# Patient Record
Sex: Female | Born: 1975 | Race: Black or African American | Hispanic: No | Marital: Single | Smoking: Never smoker
Health system: Southern US, Community
[De-identification: ages and names within clinical notes are randomized; demographics above are authoritative.]

## PROBLEM LIST (undated history)

## (undated) DIAGNOSIS — F32A Depression, unspecified: Secondary | ICD-10-CM

## (undated) DIAGNOSIS — G4730 Sleep apnea, unspecified: Secondary | ICD-10-CM

## (undated) DIAGNOSIS — R7303 Prediabetes: Secondary | ICD-10-CM

## (undated) DIAGNOSIS — K219 Gastro-esophageal reflux disease without esophagitis: Secondary | ICD-10-CM

## (undated) DIAGNOSIS — R011 Cardiac murmur, unspecified: Secondary | ICD-10-CM

## (undated) DIAGNOSIS — E119 Type 2 diabetes mellitus without complications: Secondary | ICD-10-CM

## (undated) DIAGNOSIS — F419 Anxiety disorder, unspecified: Secondary | ICD-10-CM

## (undated) DIAGNOSIS — G43909 Migraine, unspecified, not intractable, without status migrainosus: Secondary | ICD-10-CM

## (undated) DIAGNOSIS — F329 Major depressive disorder, single episode, unspecified: Secondary | ICD-10-CM

## (undated) DIAGNOSIS — D440 Neoplasm of uncertain behavior of thyroid gland: Secondary | ICD-10-CM

## (undated) DIAGNOSIS — I10 Essential (primary) hypertension: Secondary | ICD-10-CM

## (undated) DIAGNOSIS — N200 Calculus of kidney: Secondary | ICD-10-CM

## (undated) HISTORY — PX: DILATION AND CURETTAGE OF UTERUS: SHX78

## (undated) HISTORY — PX: TUBAL LIGATION: SHX77

## (undated) HISTORY — PX: WISDOM TOOTH EXTRACTION: SHX21

---
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
## 2010-08-22 ENCOUNTER — Other Ambulatory Visit: Payer: Self-pay | Admitting: Internal Medicine

## 2010-08-22 DIAGNOSIS — K3189 Other diseases of stomach and duodenum: Secondary | ICD-10-CM

## 2010-09-01 ENCOUNTER — Ambulatory Visit
Admission: RE | Admit: 2010-09-01 | Discharge: 2010-09-01 | Disposition: A | Discharge status: Home / Self Care | Payer: Medicaid Other | Source: Ambulatory Visit | Attending: Internal Medicine | Admitting: Internal Medicine

## 2010-09-01 DIAGNOSIS — R1013 Epigastric pain: Secondary | ICD-10-CM

## 2010-09-13 ENCOUNTER — Emergency Department (HOSPITAL_COMMUNITY)
Admission: EM | Admit: 2010-09-13 | Discharge: 2010-09-13 | Disposition: A | Discharge status: Home / Self Care | Payer: Medicaid Other | Attending: Emergency Medicine | Admitting: Emergency Medicine

## 2010-09-13 ENCOUNTER — Emergency Department (HOSPITAL_COMMUNITY): Payer: Medicaid Other

## 2010-09-13 DIAGNOSIS — W010XXA Fall on same level from slipping, tripping and stumbling without subsequent striking against object, initial encounter: Secondary | ICD-10-CM | POA: Insufficient documentation

## 2010-09-13 DIAGNOSIS — I10 Essential (primary) hypertension: Secondary | ICD-10-CM | POA: Insufficient documentation

## 2010-09-13 DIAGNOSIS — M25569 Pain in unspecified knee: Secondary | ICD-10-CM | POA: Insufficient documentation

## 2010-09-13 DIAGNOSIS — F319 Bipolar disorder, unspecified: Secondary | ICD-10-CM | POA: Insufficient documentation

## 2010-11-09 ENCOUNTER — Emergency Department (HOSPITAL_COMMUNITY)
Admission: EM | Admit: 2010-11-09 | Discharge: 2010-11-10 | Disposition: A | Discharge status: Home / Self Care | Payer: Medicaid Other | Attending: Emergency Medicine | Admitting: Emergency Medicine

## 2010-11-09 DIAGNOSIS — M25569 Pain in unspecified knee: Secondary | ICD-10-CM | POA: Insufficient documentation

## 2010-11-09 DIAGNOSIS — M7120 Synovial cyst of popliteal space [Baker], unspecified knee: Secondary | ICD-10-CM | POA: Insufficient documentation

## 2010-11-09 DIAGNOSIS — I10 Essential (primary) hypertension: Secondary | ICD-10-CM | POA: Insufficient documentation

## 2011-03-14 ENCOUNTER — Emergency Department (HOSPITAL_BASED_OUTPATIENT_CLINIC_OR_DEPARTMENT_OTHER)
Admission: EM | Admit: 2011-03-14 | Discharge: 2011-03-14 | Disposition: A | Discharge status: Home / Self Care | Payer: Medicaid Other | Attending: Emergency Medicine | Admitting: Emergency Medicine

## 2011-03-14 ENCOUNTER — Encounter (HOSPITAL_BASED_OUTPATIENT_CLINIC_OR_DEPARTMENT_OTHER): Payer: Self-pay | Admitting: *Deleted

## 2011-03-14 DIAGNOSIS — G43909 Migraine, unspecified, not intractable, without status migrainosus: Secondary | ICD-10-CM | POA: Insufficient documentation

## 2011-03-14 DIAGNOSIS — H53149 Visual discomfort, unspecified: Secondary | ICD-10-CM | POA: Insufficient documentation

## 2011-03-14 HISTORY — DX: Migraine, unspecified, not intractable, without status migrainosus: G43.909

## 2011-03-14 MED ORDER — SODIUM CHLORIDE 0.9 % IV SOLN: Freq: Once | INTRAVENOUS | Status: DC

## 2011-03-14 MED ORDER — MORPHINE SULFATE 2 MG/ML IJ SOLN
2.0000 mg | Freq: Once | INTRAMUSCULAR | Status: AC
  Administered 2011-03-14: 2 mg via INTRAVENOUS
  Filled 2011-03-14: qty 1

## 2011-03-14 MED ORDER — DEXAMETHASONE SODIUM PHOSPHATE 10 MG/ML IJ SOLN
10.0000 mg | Freq: Once | INTRAMUSCULAR | Status: AC
  Administered 2011-03-14: 10 mg via INTRAVENOUS
  Filled 2011-03-14: qty 1

## 2011-03-14 MED ORDER — METOCLOPRAMIDE HCL 5 MG/ML IJ SOLN
10.0000 mg | Freq: Once | INTRAMUSCULAR | Status: AC
  Administered 2011-03-14: 10 mg via INTRAVENOUS
  Filled 2011-03-14: qty 2

## 2011-03-14 MED ORDER — DIPHENHYDRAMINE HCL 50 MG/ML IJ SOLN
25.0000 mg | Freq: Once | INTRAMUSCULAR | Status: AC
  Administered 2011-03-14: 25 mg via INTRAVENOUS
  Filled 2011-03-14: qty 1

## 2011-03-14 MED ORDER — SODIUM CHLORIDE 0.9 % IV BOLUS (SEPSIS)
1000.0000 mL | Freq: Once | INTRAVENOUS | Status: AC
  Administered 2011-03-14: 1000 mL via INTRAVENOUS

## 2011-03-14 NOTE — ED Notes (Addendum)
Pt has hx of migraines and takes meds for same, but states this H/A is different. "More pressure" No relief with usual meds. Also c/o nausea. PERLP

## 2011-03-14 NOTE — ED Provider Notes (Signed)
History   This chart was scribed for Toy Baker, MD scribed by Magnus Sinning. The patient was seen in room MH08/MH08 seen at 16:34   CSN: [id number]  Arrival date & time 03/14/11  1604   First MD Initiated Contact with Patient 03/14/11 1623      Chief Complaint  Patient presents with  . Headache    (Consider location/radiation/quality/duration/timing/severity/associated sxs/prior treatment) HPI Kelli Carroll is a 36 y.o. female who presents to the Emergency Department complaining of constant severe HA with associated nausea, onset yesterday when she was at work. Pt has a hx of migraines, but says that this migraine is worst than previous HAs. She normally takes Fioricet and says she took one today with no relief. She is unsure what triggers her headache, but says that HA is aggravated by light and loud sound. Denies vomiting,fever, and neck pain. LNMP: 03/05/11 Past Medical History  Diagnosis Date  . Migraines     Past Surgical History  Procedure Date  . Tubal ligation     History reviewed. No pertinent family history.  History  Substance Use Topics  . Smoking status: Never Smoker   . Smokeless tobacco: Not on file  . Alcohol Use: No   Review of Systems  Eyes: Positive for photophobia.  Gastrointestinal: Positive for nausea.  Neurological: Positive for headaches.  All other systems reviewed and are negative.    Allergies  Review of patient's allergies indicates no known allergies.  Home Medications   Current Outpatient Rx  Name Route Sig Dispense Refill  . AMLODIPINE BESYLATE 5 MG PO TABS Oral Take 5 mg by mouth daily.    Marland Kitchen BUTALBITAL-APAP-CAFFEINE 50-325-40 MG PO TABS Oral Take 1 tablet by mouth every 6 (six) hours as needed. For headache    . DIPHENHYDRAMINE-APAP (SLEEP) 25-500 MG PO TABS Oral Take 2 tablets by mouth at bedtime as needed. For headache    . LORATADINE 10 MG PO TABS Oral Take 10 mg by mouth daily.    Marland Kitchen OMEPRAZOLE 40 MG PO CPDR Oral Take  40 mg by mouth daily.      BP 126/87  Pulse 74  Temp(Src) 98.1 F (36.7 C) (Oral)  Resp 20  Ht 5\' 2"  (1.575 m)  Wt 246 lb (111.585 kg)  BMI 44.99 kg/m2  SpO2 100%  LMP 03/05/2011  Physical Exam  Nursing note and vitals reviewed. Constitutional: She is oriented to person, place, and time. She appears well-developed and well-nourished. No distress.  HENT:  Head: Normocephalic and atraumatic.  Eyes: EOM are normal. Pupils are equal, round, and reactive to light.  Neck: Neck supple. No tracheal deviation present.  Cardiovascular: Normal rate.   Pulmonary/Chest: Effort normal. No respiratory distress.  Abdominal: Soft. She exhibits no distension.  Musculoskeletal: Normal range of motion. She exhibits no edema.  Neurological: She is alert and oriented to person, place, and time. No sensory deficit.  Skin: Skin is warm and dry.  Psychiatric: Her behavior is normal. Her affect is blunt.    ED Course  Procedures (including critical care time) DIAGNOSTIC STUDIES: Oxygen Saturation is 100% on room air, normal by my interpretation.    COORDINATION OF CARE: 1645: Physician places orders for the following:  metoCLOPramide (REGLAN) injection 10 mg Once dexamethasone (DECADRON) injection 10 mg Once 0.9 % sodium chloride infusion Once  sodium chloride 0.9 % bolus 1,000 mL Once diphenhydrAMINE (BENADRYL) injection 25 mg Once  morphine 2 MG/ML injection 2 mg Once    Labs Reviewed - No  data to display No results found.   No diagnosis found.    MDM  I personally performed the services described in this documentation, which was scribed in my presence. The recorded information has been reviewed and considered.  Pt given meds for headache and now feels better--h/o similar episodes a/w her chronic migraines--no concern for sah or meningitis--repeat neuro exam at d/c stable--pt given neurology referral \        Toy Baker, MD 03/14/11 1734

## 2011-03-14 NOTE — Discharge Instructions (Signed)
Recurrent Migraine Headache You have a recurrent migraine headache. The caregiver can usually provide good relief for this headache. If this headache is the same as your previous migraine headaches, it is safe to treat you without repeating a complete evaluation.  These headaches usually have at least two of the following problems:   They occur on one side of the head, pulsate, and are severe enough to prevent daily activities.   They are aggravated by daily physical activities.  You may have one or more of the following symptoms:   Nausea (feeling sick to your stomach).   Vomiting.   Pain with exposure to bright lights or loud noises.  Most headache sufferers have a family history of migraines. Your headaches may also be related to alcohol and smoking habits. Too much sleep, too little sleep, mood, and anxiety may also play a part. Changing some of these triggers may help you lower the number and level of pain of the headaches. Headaches may be related to menses (female menstruation). There are numerous medications that can prevent these headaches. Your caregiver can help you with a medication or regimen (procedure to follow). If this has been a chronic (long-term) condition, the use of long-term narcotics is not recommended. Using long-term narcotics can cause recurrent migraines. Narcotics are only a temporary measure only. They are used for the infrequent migraine that fails to respond to all other measures. SEEK MEDICAL CARE IF:   You do not get relief from the medications given to you.   You have a recurrence of pain.   This headache begins to differ from past migraine (for example if it is more severe).  SEEK IMMEDIATE MEDICAL CARE IF:  You have a fever.   You have a stiff neck.   You have vision loss or have changes in vision.   You have problems with feeling lightheaded, become faint, or lose your balance.   You have muscular weakness.   You have loss of muscular control.     You develop severe symptoms different from your first symptoms.   You start losing your balance or have trouble walking.   You feel faint or pass out.  MAKE SURE YOU:   Understand these instructions.   Will watch your condition.   Will get help right away if you are not doing well or get worse.  Document Released: 09/30/2000 Document Revised: 09/17/2010 Document Reviewed: 08/25/2007 [employer] Patient Information 2012 [employer]. 

## 2011-06-10 ENCOUNTER — Emergency Department (HOSPITAL_BASED_OUTPATIENT_CLINIC_OR_DEPARTMENT_OTHER): Payer: BC Managed Care – PPO

## 2011-06-10 ENCOUNTER — Encounter (HOSPITAL_BASED_OUTPATIENT_CLINIC_OR_DEPARTMENT_OTHER): Payer: Self-pay | Admitting: *Deleted

## 2011-06-10 ENCOUNTER — Emergency Department (HOSPITAL_BASED_OUTPATIENT_CLINIC_OR_DEPARTMENT_OTHER)
Admission: EM | Admit: 2011-06-10 | Discharge: 2011-06-10 | Disposition: A | Discharge status: Home / Self Care | Payer: BC Managed Care – PPO | Attending: Emergency Medicine | Admitting: Emergency Medicine

## 2011-06-10 DIAGNOSIS — M7989 Other specified soft tissue disorders: Secondary | ICD-10-CM | POA: Insufficient documentation

## 2011-06-10 DIAGNOSIS — I10 Essential (primary) hypertension: Secondary | ICD-10-CM | POA: Insufficient documentation

## 2011-06-10 DIAGNOSIS — M25579 Pain in unspecified ankle and joints of unspecified foot: Secondary | ICD-10-CM

## 2011-06-10 HISTORY — DX: Essential (primary) hypertension: I10

## 2011-06-10 HISTORY — DX: Gastro-esophageal reflux disease without esophagitis: K21.9

## 2011-06-10 MED ORDER — HYDROCODONE-ACETAMINOPHEN 5-500 MG PO TABS: 1.0000 | ORAL_TABLET | Freq: Four times a day (QID) | ORAL | Status: AC | PRN

## 2011-06-10 MED ORDER — HYDROCODONE-ACETAMINOPHEN 5-325 MG PO TABS
1.0000 | ORAL_TABLET | Freq: Once | ORAL | Status: AC
  Administered 2011-06-10: 1 via ORAL
  Filled 2011-06-10: qty 1

## 2011-06-10 NOTE — ED Provider Notes (Signed)
Medical screening examination/treatment/procedure(s) were performed by non-physician practitioner and as supervising physician I was immediately available for consultation/collaboration.  Ethelda Chick, MD 06/10/11 2145

## 2011-06-10 NOTE — ED Provider Notes (Signed)
History     CSN: [id number]  Arrival date & time 06/10/11  8657   First MD Initiated Contact with Patient 06/10/11 1957      Chief Complaint  Patient presents with  . Ankle Pain    (Consider location/radiation/quality/duration/timing/severity/associated sxs/prior treatment) Patient is a 36 y.o. female presenting with ankle pain. The history is provided by the patient. No language interpreter was used.  Ankle Pain  The incident occurred 2 days ago. The injury mechanism is unknown. The pain is present in the right ankle. The quality of the pain is described as aching. The pain is moderate. The pain has been constant since onset. Pertinent negatives include no numbness, no inability to bear weight, no loss of motion and no tingling. She reports no foreign bodies present. The symptoms are aggravated by activity. She has tried nothing for the symptoms.    Past Medical History  Diagnosis Date  . Migraines   . Hypertension   . GERD (gastroesophageal reflux disease)     Past Surgical History  Procedure Date  . Tubal ligation   . Tubal ligation     History reviewed. No pertinent family history.  History  Substance Use Topics  . Smoking status: Never Smoker   . Smokeless tobacco: Not on file  . Alcohol Use: No    OB History    Grav Para Term Preterm Abortions TAB SAB Ect Mult Living                  Review of Systems  Constitutional: Negative.   Respiratory: Negative.   Cardiovascular: Negative.   Neurological: Negative for tingling and numbness.    Allergies  Review of patient's allergies indicates no known allergies.  Home Medications   Current Outpatient Rx  Name Route Sig Dispense Refill  . AMLODIPINE BESYLATE 5 MG PO TABS Oral Take 5 mg by mouth daily.    Marland Kitchen BUTALBITAL-APAP-CAFFEINE 50-325-40 MG PO TABS Oral Take 1 tablet by mouth every 6 (six) hours as needed. For headache    . DIPHENHYDRAMINE-APAP (SLEEP) 25-500 MG PO TABS Oral Take 2 tablets by mouth at  bedtime as needed. For headache    . LORATADINE 10 MG PO TABS Oral Take 10 mg by mouth daily.    Marland Kitchen OMEPRAZOLE 40 MG PO CPDR Oral Take 40 mg by mouth daily.      Pulse 57  Temp(Src) 98.2 F (36.8 C) (Oral)  Resp 16  Ht 5\' 2"  (1.575 m)  Wt 250 lb (113.399 kg)  BMI 45.73 kg/m2  SpO2 100%  Physical Exam  Nursing note and vitals reviewed. Constitutional: She is oriented to person, place, and time. She appears well-developed and well-nourished.  HENT:  Head: Normocephalic and atraumatic.  Cardiovascular: Normal rate and regular rhythm.   Pulmonary/Chest: Effort normal and breath sounds normal.  Musculoskeletal:       Pt has swelling and tenderness noted to the right lateral ankle:pt has full QIO:NGEXBM intact  Neurological: She is alert and oriented to person, place, and time.  Skin: Skin is warm and dry.       No redness or warmth noted to the ankle  Psychiatric: She has a normal mood and affect.    ED Course  Procedures (including critical care time)  Labs Reviewed - No data to display Dg Ankle Complete Right  06/10/2011  *RADIOLOGY REPORT*  Clinical Data: Lateral right ankle pain after exercising yesterday. No known injury.  RIGHT ANKLE - COMPLETE 3+ VIEW  Comparison: None.  Findings: No evidence of acute, subacute, or healed fractures. Ankle mortise intact with well-preserved joint space.  No intrinsic osseous abnormalities.  No evidence of a significant joint effusion.  Phlebolith noted in the subcutaneous tissues medially.  IMPRESSION: No osseous abnormality.  Original Report Authenticated By: Arnell Sieving, M.D.     1. Ankle pain       MDM  Nursing placed in aso for comfort:will treat with something for pain        Teressa Lower, NP 06/10/11 2135

## 2011-06-10 NOTE — Discharge Instructions (Signed)
Ankle Pain  Ankle pain is a common symptom. The bones, cartilage, tendons, and muscles of the ankle joint perform a lot of work each day. The ankle joint holds your body weight and allows you to move around. Ankle pain can occur on either side or back of 1 or both ankles. Ankle pain may be sharp and burning or dull and aching. There may be tenderness, stiffness, redness, or warmth around the ankle. The pain occurs more often when a person walks or puts pressure on the ankle.  CAUSES   There are many reasons ankle pain can develop. It is important to work with your caregiver to identify the cause since many conditions can impact the bones, cartilage, muscles, and tendons. Causes for ankle pain include:  · Injury, including a break (fracture), sprain, or strain often due to a fall, sports, or a high-impact activity.  · Swelling (inflammation) of a tendon (tendonitis).  · Achilles tendon rupture.  · Ankle instability after repeated sprains and strains.  · Poor foot alignment.  · Pressure on a nerve (tarsal tunnel syndrome).  · Arthritis in the ankle or the lining of the ankle.  · Crystal formation in the ankle (gout or pseudogout).  DIAGNOSIS   A diagnosis is based on your medical history, your symptoms, results of your physical exam, and results of diagnostic tests. Diagnostic tests may include X-ray exams or a computerized magnetic scan (magnetic resonance imaging, MRI).  TREATMENT   Treatment will depend on the cause of your ankle pain and may include:  · Keeping pressure off the ankle and limiting activities.  · Using crutches or other walking support (a cane or brace).  · Using rest, ice, compression, and elevation.  · Participating in physical therapy or home exercises.  · Wearing shoe inserts or special shoes.  · Losing weight.  · Taking medications to reduce pain or swelling or receiving an injection.  · Undergoing surgery.  HOME CARE INSTRUCTIONS   · Only take over-the-counter or prescription medicines for  pain, discomfort, or fever as directed by your caregiver.  · Put ice on the injured area.  · Put ice in a plastic bag.  · Place a towel between your skin and the bag.  · Leave the ice on for 15 to 20 minutes at a time, 3 to 4 times a day.  · Keep your leg raised (elevated) when possible to lessen swelling.  · Avoid activities that cause ankle pain.  · Follow specific exercises as directed by your caregiver.  · Record how often you have ankle pain, the location of the pain, and what it feels like. This information may be helpful to you and your caregiver.  · Ask your caregiver about returning to work or sports and whether you should drive.  · Follow up with your caregiver for further examination, therapy, or testing as directed.  SEEK MEDICAL CARE IF:   · Pain or swelling continues or worsens beyond 1 week.  · You have an oral temperature above 102° F (38.9° C).  · You are feeling unwell or have chills.  · You are having an increasingly difficult time with walking.  · You have loss of sensation or other new symptoms.  · You have questions or concerns.  MAKE SURE YOU:   · Understand these instructions.  · Will watch your condition.  · Will get help right away if you are not doing well or get worse.  Document Released: 06/25/2009 Document Revised: 12/25/2010 Document   Reviewed: 06/25/2009  [employer]® Patient Information ©2012 [employer].

## 2011-06-10 NOTE — ED Notes (Signed)
Pt c/o right ankle pain and lower leg pain w/o injury x 2 days

## 2011-08-18 ENCOUNTER — Emergency Department (HOSPITAL_COMMUNITY)
Admission: EM | Admit: 2011-08-18 | Discharge: 2011-08-18 | Disposition: A | Discharge status: Home / Self Care | Payer: BC Managed Care – PPO | Attending: Emergency Medicine | Admitting: Emergency Medicine

## 2011-08-18 ENCOUNTER — Encounter (HOSPITAL_COMMUNITY): Payer: Self-pay | Admitting: *Deleted

## 2011-08-18 DIAGNOSIS — K219 Gastro-esophageal reflux disease without esophagitis: Secondary | ICD-10-CM | POA: Insufficient documentation

## 2011-08-18 DIAGNOSIS — I10 Essential (primary) hypertension: Secondary | ICD-10-CM | POA: Insufficient documentation

## 2011-08-18 DIAGNOSIS — G43909 Migraine, unspecified, not intractable, without status migrainosus: Secondary | ICD-10-CM | POA: Insufficient documentation

## 2011-08-18 DIAGNOSIS — M436 Torticollis: Secondary | ICD-10-CM | POA: Insufficient documentation

## 2011-08-18 MED ORDER — TRAMADOL HCL 50 MG PO TABS: 50.0000 mg | ORAL_TABLET | Freq: Four times a day (QID) | ORAL | Status: AC | PRN

## 2011-08-18 MED ORDER — CYCLOBENZAPRINE HCL 10 MG PO TABS: 10.0000 mg | ORAL_TABLET | Freq: Two times a day (BID) | ORAL | Status: AC | PRN

## 2011-08-18 NOTE — ED Provider Notes (Signed)
Medical screening examination/treatment/procedure(s) were performed by non-physician practitioner and as supervising physician I was immediately available for consultation/collaboration.   Nyia Tsao Y. Terrye Dombrosky, MD 08/18/11 2356 

## 2011-08-18 NOTE — ED Provider Notes (Signed)
History     CSN: [id number]  Arrival date & time 08/18/11  1812   First MD Initiated Contact with Patient 08/18/11 2016      Chief Complaint  Patient presents with  . Shoulder Pain  . Neck Pain    (Consider location/radiation/quality/duration/timing/severity/associated sxs/prior treatment) HPI  36 year old female with history of migraine presents complaining of right shoulder pain. She reports pain is been ongoing for the past 2 weeks. Gradual in onset, starting from R shoulder and slowly affecting her R side of neck and down her R arm.  Pain is described as a throbbing sensation worsening with movement. Pain is causing her to have a headache. She denies any specific injury to the shoulder. She denies any rash. Denies fever, chills, chest pain, shortness of breath. Patient believes "I slept wrong".  Has tried taking OTC ibuprofen and tylenol with minimal relief.    Past Medical History  Diagnosis Date  . Migraines   . Hypertension   . GERD (gastroesophageal reflux disease)     Past Surgical History  Procedure Date  . Tubal ligation   . Tubal ligation     No family history on file.  History  Substance Use Topics  . Smoking status: Never Smoker   . Smokeless tobacco: Not on file  . Alcohol Use: No    OB History    Grav Para Term Preterm Abortions TAB SAB Ect Mult Living                  Review of Systems  Constitutional: Negative for fever.  HENT: Positive for neck pain and neck stiffness. Negative for trouble swallowing.   Respiratory: Negative for shortness of breath.   Cardiovascular: Negative for chest pain.  Skin: Negative for rash and wound.  Neurological: Negative for numbness.  All other systems reviewed and are negative.    Allergies  Review of patient's allergies indicates no known allergies.  Home Medications   Current Outpatient Rx  Name Route Sig Dispense Refill  . AMLODIPINE BESYLATE 5 MG PO TABS Oral Take 5 mg by mouth daily.    Marland Kitchen  BUTALBITAL-APAP-CAFFEINE 50-325-40 MG PO TABS Oral Take 1 tablet by mouth every 6 (six) hours as needed. For headache    . DIPHENHYDRAMINE-APAP (SLEEP) 25-500 MG PO TABS Oral Take 2 tablets by mouth at bedtime as needed. For headache    . OMEPRAZOLE 40 MG PO CPDR Oral Take 40 mg by mouth daily.      BP 155/96  Pulse 71  Temp 98.6 F (37 C) (Oral)  Resp 16  SpO2 100%  Physical Exam  Nursing note and vitals reviewed. Constitutional: She is oriented to person, place, and time. She appears well-developed and well-nourished. No distress.       Morbidly obese  HENT:  Head: Normocephalic and atraumatic.  Eyes: Conjunctivae are normal.  Neck: Neck supple. No JVD present.  Pulmonary/Chest: No stridor.  Musculoskeletal:       Patient has decreased neck range of motion especially to rotational movement. No midline spine tenderness. Tenderness to right trapezius muscle and anterior aspects of right shoulder without overlying skin changes. Decrease right shoulder range of motion actively due to pain, normal range of motion passively. No deformity noted  Lymphadenopathy:    She has no cervical adenopathy.  Neurological: She is alert and oriented to person, place, and time.  Skin: Skin is warm. No rash noted.    ED Course  Procedures (including critical care time)  Labs  Reviewed - No data to display No results found.   No diagnosis found.  1. torticollis  MDM  Right shoulder and neck pain suggestive of muscle strain, torticollis.  Normal sensation, normal grip strength no evidence of dislocation or fx.  Will give muscle relaxant, and pain medication with ortho referral.  Pt voice understanding and agrees with plan.  RICE therapy discussed.    Doubt thoracic outlet obstruction.  Does not look toxic.  Doubt meningitis, or EMC       Fayrene Helper, PA-C 08/18/11 2031  Fayrene Helper, PA-C 08/18/11 2033

## 2011-08-18 NOTE — ED Notes (Signed)
Pt reports R shoulder pain x2 weeks. Reports now going down R arm and up R neck and causing R sided HA. Denies known injury to shoulder.

## 2011-10-23 ENCOUNTER — Encounter (HOSPITAL_BASED_OUTPATIENT_CLINIC_OR_DEPARTMENT_OTHER): Payer: Self-pay

## 2011-10-23 ENCOUNTER — Emergency Department (HOSPITAL_BASED_OUTPATIENT_CLINIC_OR_DEPARTMENT_OTHER)
Admission: EM | Admit: 2011-10-23 | Discharge: 2011-10-23 | Disposition: A | Discharge status: Home / Self Care | Payer: BC Managed Care – PPO | Attending: Emergency Medicine | Admitting: Emergency Medicine

## 2011-10-23 DIAGNOSIS — I10 Essential (primary) hypertension: Secondary | ICD-10-CM | POA: Insufficient documentation

## 2011-10-23 DIAGNOSIS — G43909 Migraine, unspecified, not intractable, without status migrainosus: Secondary | ICD-10-CM | POA: Insufficient documentation

## 2011-10-23 DIAGNOSIS — K219 Gastro-esophageal reflux disease without esophagitis: Secondary | ICD-10-CM | POA: Insufficient documentation

## 2011-10-23 MED ORDER — OXYCODONE-ACETAMINOPHEN 5-325 MG PO TABS
2.0000 | ORAL_TABLET | Freq: Once | ORAL | Status: AC
  Administered 2011-10-23: 2 via ORAL
  Filled 2011-10-23 (×2): qty 2

## 2011-10-23 NOTE — ED Provider Notes (Signed)
History     CSN: [id number]  Arrival date & time 10/23/11  4098   First MD Initiated Contact with Patient 10/23/11 1927      Chief Complaint  Patient presents with  . Headache    (Consider location/radiation/quality/duration/timing/severity/associated sxs/prior treatment) HPI Patient complaining of headache began a week ago gradual onset like prior migraines.  Patient took fioricet and tylenol pm without relief. No other meds taken for headache.  No nausea, vomiting, fever, focal deficit, or fever.  No recent injury.  pmd  Past Medical History  Diagnosis Date  . Migraines   . Hypertension   . GERD (gastroesophageal reflux disease)     Past Surgical History  Procedure Date  . Tubal ligation   . Tubal ligation     No family history on file.  History  Substance Use Topics  . Smoking status: Never Smoker   . Smokeless tobacco: Not on file  . Alcohol Use: No    OB History    Grav Para Term Preterm Abortions TAB SAB Ect Mult Living                  Review of Systems  Constitutional: Negative for fever, chills, activity change, appetite change and unexpected weight change.  HENT: Negative for sore throat, rhinorrhea, neck pain, neck stiffness and sinus pressure.   Eyes: Negative for visual disturbance.  Respiratory: Negative for cough and shortness of breath.   Cardiovascular: Negative for chest pain and leg swelling.  Gastrointestinal: Negative for vomiting, abdominal pain, diarrhea and blood in stool.  Genitourinary: Negative for dysuria, urgency, frequency, vaginal discharge and difficulty urinating.  Musculoskeletal: Negative for myalgias, arthralgias and gait problem.  Skin: Negative for color change and rash.  Neurological: Negative for weakness, light-headedness and headaches.  Hematological: Does not bruise/bleed easily.  Psychiatric/Behavioral: Negative for dysphoric mood.    Allergies  Review of patient's allergies indicates no known allergies.  Home  Medications   Current Outpatient Rx  Name Route Sig Dispense Refill  . AMLODIPINE BESYLATE 5 MG PO TABS Oral Take 5 mg by mouth daily.    Marland Kitchen BUTALBITAL-APAP-CAFFEINE 50-325-40 MG PO TABS Oral Take 1 tablet by mouth every 6 (six) hours as needed. For headache    . DIPHENHYDRAMINE-APAP (SLEEP) 25-500 MG PO TABS Oral Take 2 tablets by mouth at bedtime as needed. For headache    . OMEPRAZOLE 40 MG PO CPDR Oral Take 40 mg by mouth daily.      BP 152/93  Pulse 79  Temp 97.9 F (36.6 C) (Oral)  Resp 20  Ht 5\' 4"  (1.626 m)  Wt 256 lb (116.121 kg)  BMI 43.94 kg/m2  SpO2 100%  Physical Exam  Nursing note and vitals reviewed. Constitutional: She is oriented to person, place, and time. She appears well-developed and well-nourished.  HENT:  Head: Normocephalic and atraumatic.  Eyes: Conjunctivae normal and EOM are normal. Pupils are equal, round, and reactive to light.  Neck: Normal range of motion. Neck supple.  Cardiovascular: Normal rate, regular rhythm, normal heart sounds and intact distal pulses.   Pulmonary/Chest: Effort normal and breath sounds normal.  Abdominal: Soft. Bowel sounds are normal.  Musculoskeletal: Normal range of motion.  Neurological: She is alert and oriented to person, place, and time. She has normal strength and normal reflexes. No sensory deficit. She displays a negative Romberg sign. GCS eye subscore is 4. GCS verbal subscore is 5. GCS motor subscore is 6.  Skin: Skin is warm and dry.  Psychiatric: She has a normal mood and affect. Thought content normal.    ED Course  Procedures (including critical care time)  Labs Reviewed - No data to display No results found.   No diagnosis found.    MDM  Migraine headache.  Patient given Percocet and requests discharge home.    Hilario Quarry, MD 10/23/11 [phone]

## 2011-10-23 NOTE — ED Notes (Signed)
C/o HA to back of head and behind both eyes x 1 week

## 2012-02-22 ENCOUNTER — Emergency Department (HOSPITAL_BASED_OUTPATIENT_CLINIC_OR_DEPARTMENT_OTHER)
Admission: EM | Admit: 2012-02-22 | Discharge: 2012-02-22 | Disposition: A | Discharge status: Home / Self Care | Payer: BC Managed Care – PPO | Attending: Emergency Medicine | Admitting: Emergency Medicine

## 2012-02-22 ENCOUNTER — Encounter (HOSPITAL_BASED_OUTPATIENT_CLINIC_OR_DEPARTMENT_OTHER): Payer: Self-pay | Admitting: *Deleted

## 2012-02-22 ENCOUNTER — Emergency Department (HOSPITAL_BASED_OUTPATIENT_CLINIC_OR_DEPARTMENT_OTHER): Payer: BC Managed Care – PPO

## 2012-02-22 DIAGNOSIS — R059 Cough, unspecified: Secondary | ICD-10-CM | POA: Insufficient documentation

## 2012-02-22 DIAGNOSIS — H9209 Otalgia, unspecified ear: Secondary | ICD-10-CM | POA: Insufficient documentation

## 2012-02-22 DIAGNOSIS — B9789 Other viral agents as the cause of diseases classified elsewhere: Secondary | ICD-10-CM | POA: Insufficient documentation

## 2012-02-22 DIAGNOSIS — I10 Essential (primary) hypertension: Secondary | ICD-10-CM | POA: Insufficient documentation

## 2012-02-22 DIAGNOSIS — J3489 Other specified disorders of nose and nasal sinuses: Secondary | ICD-10-CM | POA: Insufficient documentation

## 2012-02-22 DIAGNOSIS — B349 Viral infection, unspecified: Secondary | ICD-10-CM

## 2012-02-22 DIAGNOSIS — G43909 Migraine, unspecified, not intractable, without status migrainosus: Secondary | ICD-10-CM | POA: Insufficient documentation

## 2012-02-22 DIAGNOSIS — R1310 Dysphagia, unspecified: Secondary | ICD-10-CM | POA: Insufficient documentation

## 2012-02-22 DIAGNOSIS — R05 Cough: Secondary | ICD-10-CM | POA: Insufficient documentation

## 2012-02-22 DIAGNOSIS — K219 Gastro-esophageal reflux disease without esophagitis: Secondary | ICD-10-CM | POA: Insufficient documentation

## 2012-02-22 DIAGNOSIS — Z79899 Other long term (current) drug therapy: Secondary | ICD-10-CM | POA: Insufficient documentation

## 2012-02-22 MED ORDER — HYDROCODONE-HOMATROPINE 5-1.5 MG/5ML PO SYRP: 5.0000 mL | ORAL_SOLUTION | Freq: Four times a day (QID) | ORAL | Status: DC | PRN

## 2012-02-22 MED ORDER — LIDOCAINE VISCOUS 2 % MT SOLN
20.0000 mL | Freq: Once | OROMUCOSAL | Status: AC
  Administered 2012-02-22: 20 mL via OROMUCOSAL
  Filled 2012-02-22: qty 15

## 2012-02-22 MED ORDER — DEXAMETHASONE 4 MG PO TABS
10.0000 mg | ORAL_TABLET | Freq: Once | ORAL | Status: AC
  Administered 2012-02-22: 10 mg via ORAL
  Filled 2012-02-22: qty 2
  Filled 2012-02-22: qty 1

## 2012-02-22 NOTE — ED Provider Notes (Signed)
History     CSN: [id number]  Arrival date & time 02/22/12  1023   First MD Initiated Contact with Patient 02/22/12 1052      Chief Complaint  Patient presents with  . Sore Throat  . Otalgia    (Consider location/radiation/quality/duration/timing/severity/associated sxs/prior treatment) HPI The patient presents with illness for 4 days.  Since onset she has had cough, congestion, generalized discomfort, and increasingly severe sore throat with worsening dysphagia.  No relief with OTC medication. No confusion, no disorientation, no visual changes, no ataxia, no dyspnea.  The patient states that the most severe aspect of her illness is the sore throat.  Past Medical History  Diagnosis Date  . Migraines   . Hypertension   . GERD (gastroesophageal reflux disease)     Past Surgical History  Procedure Date  . Tubal ligation   . Tubal ligation     History reviewed. No pertinent family history.  History  Substance Use Topics  . Smoking status: Never Smoker   . Smokeless tobacco: Not on file  . Alcohol Use: No    OB History    Grav Para Term Preterm Abortions TAB SAB Ect Mult Living                  Review of Systems  Constitutional:       Per HPI, otherwise negative  HENT:       Per HPI, otherwise negative  Eyes: Negative.   Respiratory:       Per HPI, otherwise negative  Cardiovascular:       Per HPI, otherwise negative  Gastrointestinal: Negative for vomiting.  Genitourinary: Negative.   Musculoskeletal:       Per HPI, otherwise negative  Skin: Negative.   Neurological: Negative for syncope.    Allergies  Review of patient's allergies indicates no known allergies.  Home Medications   Current Outpatient Rx  Name  Route  Sig  Dispense  Refill  . AMLODIPINE BESYLATE 5 MG PO TABS   Oral   Take 5 mg by mouth daily.         Marland Kitchen BUTALBITAL-APAP-CAFFEINE 50-325-40 MG PO TABS   Oral   Take 1 tablet by mouth every 6 (six) hours as needed. For headache         . DIPHENHYDRAMINE-APAP (SLEEP) 25-500 MG PO TABS   Oral   Take 2 tablets by mouth at bedtime as needed. For headache         . OMEPRAZOLE 40 MG PO CPDR   Oral   Take 40 mg by mouth daily.           BP 146/94  Pulse 97  Temp 98.1 F (36.7 C) (Oral)  Resp 20  Ht 5\' 2"  (1.575 m)  Wt 246 lb (111.585 kg)  BMI 44.99 kg/m2  SpO2 99%  LMP 11/22/2011  Physical Exam  Nursing note and vitals reviewed. Constitutional: She is oriented to person, place, and time. She appears well-developed and well-nourished. No distress.  HENT:  Head: Normocephalic and atraumatic.  Mouth/Throat: Uvula is midline, oropharynx is clear and moist and mucous membranes are normal.       No exudate, clear posterior oropharynx, no palpable lymph nodes throughout  Eyes: Conjunctivae normal and EOM are normal.  Cardiovascular: Normal rate and regular rhythm.   Pulmonary/Chest: Effort normal and breath sounds normal. No stridor. No respiratory distress.  Abdominal: She exhibits no distension.  Musculoskeletal: She exhibits no edema.  Neurological: She is alert and  oriented to person, place, and time. No cranial nerve deficit.  Skin: Skin is warm and dry.  Psychiatric: She has a normal mood and affect.    ED Course  Procedures (including critical care time)  Labs Reviewed - No data to display No results found.   No diagnosis found.  Pulse ox 99% room air normal  12:28 PM Patient sitting upright, in no distress.  She states that she feels somewhat better. MDM  This well appearing young F p/w several days, of cough, congestion, generalized discomfort and sore throat.  On exam the patient is hemodynamically stable, in no distress.  There is no evidence of respiratory compromise.  The patient's x-ray does not demonstrate pneumonia, she was discharged after she had some symptomatic improvement with ED interventions.        Gerhard Munch, MD 02/22/12 1230

## 2012-02-22 NOTE — ED Notes (Signed)
Sore throat and ears stopped up coughing at night states feels like sinus drainage causing her to cough

## 2012-04-11 ENCOUNTER — Encounter (HOSPITAL_BASED_OUTPATIENT_CLINIC_OR_DEPARTMENT_OTHER): Payer: Self-pay

## 2012-04-11 ENCOUNTER — Emergency Department (HOSPITAL_BASED_OUTPATIENT_CLINIC_OR_DEPARTMENT_OTHER)
Admission: EM | Admit: 2012-04-11 | Discharge: 2012-04-11 | Disposition: A | Discharge status: Home / Self Care | Payer: BC Managed Care – PPO | Attending: Emergency Medicine | Admitting: Emergency Medicine

## 2012-04-11 DIAGNOSIS — R52 Pain, unspecified: Secondary | ICD-10-CM | POA: Insufficient documentation

## 2012-04-11 DIAGNOSIS — K219 Gastro-esophageal reflux disease without esophagitis: Secondary | ICD-10-CM | POA: Insufficient documentation

## 2012-04-11 DIAGNOSIS — J020 Streptococcal pharyngitis: Secondary | ICD-10-CM | POA: Insufficient documentation

## 2012-04-11 DIAGNOSIS — R509 Fever, unspecified: Secondary | ICD-10-CM | POA: Insufficient documentation

## 2012-04-11 DIAGNOSIS — Z79899 Other long term (current) drug therapy: Secondary | ICD-10-CM | POA: Insufficient documentation

## 2012-04-11 DIAGNOSIS — G43909 Migraine, unspecified, not intractable, without status migrainosus: Secondary | ICD-10-CM | POA: Insufficient documentation

## 2012-04-11 DIAGNOSIS — R61 Generalized hyperhidrosis: Secondary | ICD-10-CM | POA: Insufficient documentation

## 2012-04-11 DIAGNOSIS — R110 Nausea: Secondary | ICD-10-CM | POA: Insufficient documentation

## 2012-04-11 DIAGNOSIS — I10 Essential (primary) hypertension: Secondary | ICD-10-CM | POA: Insufficient documentation

## 2012-04-11 MED ORDER — OXYCODONE-ACETAMINOPHEN 5-325 MG PO TABS
1.0000 | ORAL_TABLET | Freq: Once | ORAL | Status: AC
  Administered 2012-04-11: 1 via ORAL
  Filled 2012-04-11 (×2): qty 1

## 2012-04-11 MED ORDER — PENICILLIN G BENZATHINE 1200000 UNIT/2ML IM SUSP
1.2000 10*6.[IU] | Freq: Once | INTRAMUSCULAR | Status: AC
  Administered 2012-04-11: 1.2 10*6.[IU] via INTRAMUSCULAR
  Filled 2012-04-11: qty 2

## 2012-04-11 MED ORDER — DEXAMETHASONE 4 MG PO TABS
12.0000 mg | ORAL_TABLET | Freq: Once | ORAL | Status: AC
  Administered 2012-04-11: 12 mg via ORAL
  Filled 2012-04-11: qty 3

## 2012-04-11 MED ORDER — OXYCODONE-ACETAMINOPHEN 5-325 MG PO TABS: 1.0000 | ORAL_TABLET | ORAL | Status: DC | PRN

## 2012-04-11 NOTE — ED Notes (Signed)
Pt states that she feels fine after receiving IM injection of Penicillin, pt Ok'd to leave at this time.  No signs/sx of rxn.

## 2012-04-11 NOTE — ED Provider Notes (Signed)
History     CSN: [id number]  Arrival date & time 04/11/12  0714   First MD Initiated Contact with Patient 04/11/12 0732      Chief Complaint  Patient presents with  . Cough  . Generalized Body Aches    (Consider location/radiation/quality/duration/timing/severity/associated sxs/prior treatment) Patient is a 37 y.o. female presenting with cough. The history is provided by the patient.  Cough She had onset yesterday of sore throat with associated fever, chills, sweats. There's been a slight cough which is nonproductive. She's complaining of generalized bodyaches. Peak temperature was 101. She denies nasal congestion or rhinorrhea. There's been nausea but no vomiting. She denies constipation or diarrhea. She denies sick contacts. Pain is severe and she rates it at 10/10. She has taken naproxen and Vicryl with temporary relief.  Past Medical History  Diagnosis Date  . Migraines   . Hypertension   . GERD (gastroesophageal reflux disease)     Past Surgical History  Procedure Laterality Date  . Tubal ligation    . Tubal ligation      History reviewed. No pertinent family history.  History  Substance Use Topics  . Smoking status: Never Smoker   . Smokeless tobacco: Not on file  . Alcohol Use: No    OB History   Grav Para Term Preterm Abortions TAB SAB Ect Mult Living                  Review of Systems  Respiratory: Positive for cough.   All other systems reviewed and are negative.    Allergies  Review of patient's allergies indicates no known allergies.  Home Medications   Current Outpatient Rx  Name  Route  Sig  Dispense  Refill  . amLODipine (NORVASC) 5 MG tablet   Oral   Take 5 mg by mouth daily.         . butalbital-acetaminophen-caffeine (FIORICET, ESGIC) 50-325-40 MG per tablet   Oral   Take 1 tablet by mouth every 6 (six) hours as needed. For headache         . diphenhydramine-acetaminophen (TYLENOL PM EXTRA STRENGTH) 25-500 MG TABS   Oral  Take 2 tablets by mouth at bedtime as needed. For headache         . HYDROcodone-homatropine (HYCODAN) 5-1.5 MG/5ML syrup   Oral   Take 5 mLs by mouth every 6 (six) hours as needed for cough.   120 mL   0   . omeprazole (PRILOSEC) 40 MG capsule   Oral   Take 40 mg by mouth daily.           BP 133/76  Pulse 104  Temp(Src) 99.3 F (37.4 C) (Oral)  Resp 20  SpO2 98%  Physical Exam  Nursing note and vitals reviewed.  37 year old female, resting comfortably and in no acute distress. Vital signs are significant for mild tachycardia with heart rate 104. Oxygen saturation is 98%, which is normal. Head is normocephalic and atraumatic. PERRLA, EOMI. Oropharynx is mildly erythematous. There is mild to moderate tonsillar hypertrophy without exudate. She has normal phonation and is not having any difficulty with secretions. Neck is nontender and supple without adenopathy or JVD. Back is mildly tender diffusely. Lungs are clear without rales, wheezes, or rhonchi. Chest is mildly tender diffusely. Heart has regular rate and rhythm with 1-2/6 systolic ejection murmur heard along the sternal border consistent with a flow murmur. Abdomen is soft, flat, nontender without masses or hepatosplenomegaly and peristalsis is normoactive. Extremities  have no cyanosis or edema, full range of motion is present. Skin is warm and dry without rash. Neurologic: Mental status is normal, cranial nerves are intact, there are no motor or sensory deficits.  ED Course  Procedures (including critical care time)  Results for orders placed during the hospital encounter of 04/11/12  RAPID STREP SCREEN      Result Value Range   Streptococcus, Group A Screen (Direct) POSITIVE (*) NEGATIVE   No results found.    Date: 04/11/2012  Rate: 111  Rhythm: sinus tachycardia  QRS Axis: normal  Intervals: normal  ST/T Wave abnormalities: normal  Conduction Disutrbances:none  Narrative Interpretation: Sinus  tachycardia, otherwise normal ECG. No prior ECG available for comparison.  Old EKG Reviewed: none available    1. Acute streptococcal pharyngitis       MDM  Respiratory tract infection with pharyngitis and cough which is most likely viral. Strep screen will be obtained. She'll be given empiric treatment with dexamethasone.  Strep screen is come back positive. She's given an injection of Bicillin LA and discharged with prescription for Percocet. Note is given to be off work today and tomorrow.      Dione Booze, MD 04/11/12 [phone]

## 2012-04-11 NOTE — ED Notes (Signed)
Pt states that she has had cough x1 day, c/o generalized body aches, chest wall pain, nausea, no vomiting.  Pt states that she abdominal pain.

## 2012-04-13 ENCOUNTER — Emergency Department (HOSPITAL_COMMUNITY)
Admission: EM | Admit: 2012-04-13 | Discharge: 2012-04-13 | Disposition: A | Discharge status: Home / Self Care | Payer: BC Managed Care – PPO | Attending: Emergency Medicine | Admitting: Emergency Medicine

## 2012-04-13 ENCOUNTER — Encounter (HOSPITAL_COMMUNITY): Payer: Self-pay | Admitting: Emergency Medicine

## 2012-04-13 DIAGNOSIS — G43909 Migraine, unspecified, not intractable, without status migrainosus: Secondary | ICD-10-CM | POA: Insufficient documentation

## 2012-04-13 DIAGNOSIS — R509 Fever, unspecified: Secondary | ICD-10-CM | POA: Insufficient documentation

## 2012-04-13 DIAGNOSIS — R51 Headache: Secondary | ICD-10-CM | POA: Insufficient documentation

## 2012-04-13 DIAGNOSIS — J020 Streptococcal pharyngitis: Secondary | ICD-10-CM | POA: Insufficient documentation

## 2012-04-13 DIAGNOSIS — I10 Essential (primary) hypertension: Secondary | ICD-10-CM | POA: Insufficient documentation

## 2012-04-13 DIAGNOSIS — K219 Gastro-esophageal reflux disease without esophagitis: Secondary | ICD-10-CM | POA: Insufficient documentation

## 2012-04-13 DIAGNOSIS — R110 Nausea: Secondary | ICD-10-CM | POA: Insufficient documentation

## 2012-04-13 DIAGNOSIS — R197 Diarrhea, unspecified: Secondary | ICD-10-CM | POA: Insufficient documentation

## 2012-04-13 DIAGNOSIS — Z79899 Other long term (current) drug therapy: Secondary | ICD-10-CM | POA: Insufficient documentation

## 2012-04-13 DIAGNOSIS — H9209 Otalgia, unspecified ear: Secondary | ICD-10-CM | POA: Insufficient documentation

## 2012-04-13 MED ORDER — OXYCODONE-ACETAMINOPHEN 5-325 MG PO TABS: 1.0000 | ORAL_TABLET | ORAL | Status: DC | PRN

## 2012-04-13 MED ORDER — ONDANSETRON 8 MG PO TBDP
8.0000 mg | ORAL_TABLET | Freq: Once | ORAL | Status: AC
  Administered 2012-04-13: 8 mg via ORAL
  Filled 2012-04-13: qty 1

## 2012-04-13 MED ORDER — DEXAMETHASONE SODIUM PHOSPHATE 10 MG/ML IJ SOLN
10.0000 mg | Freq: Once | INTRAMUSCULAR | Status: AC
  Administered 2012-04-13: 10 mg via INTRAMUSCULAR
  Filled 2012-04-13: qty 1

## 2012-04-13 MED ORDER — CLINDAMYCIN HCL 150 MG PO CAPS: 450.0000 mg | ORAL_CAPSULE | Freq: Three times a day (TID) | ORAL | Status: DC

## 2012-04-13 MED ORDER — CLINDAMYCIN HCL 300 MG PO CAPS
450.0000 mg | ORAL_CAPSULE | Freq: Once | ORAL | Status: AC
  Administered 2012-04-13: 450 mg via ORAL
  Filled 2012-04-13: qty 1

## 2012-04-13 MED ORDER — HYDROMORPHONE HCL PF 1 MG/ML IJ SOLN
1.0000 mg | Freq: Once | INTRAMUSCULAR | Status: AC
  Administered 2012-04-13: 1 mg via INTRAMUSCULAR
  Filled 2012-04-13: qty 1

## 2012-04-13 MED ORDER — IBUPROFEN 800 MG PO TABS: 800.0000 mg | ORAL_TABLET | Freq: Three times a day (TID) | ORAL | Status: DC

## 2012-04-13 NOTE — ED Provider Notes (Signed)
History     CSN: [id number]  Arrival date & time 04/13/12  1054   First MD Initiated Contact with Patient 04/13/12 1105      Chief Complaint  Patient presents with  . Sore Throat    (Consider location/radiation/quality/duration/timing/severity/associated sxs/prior treatment) HPI Kelli Carroll is a 37 y.o. female who presents to ED with complaint of sore throat, fever, nausea, diarrhea. States was seen two days ago, was diagnosed with strep pharyngitis with positive rapid strep. Pt was given bicillin shot. States since then, feels like tonsils have gotten even more painful and swollen. States taking aleve with no improvement. States ran out of percocet that was given. Pt also states has had some nausea, vomited few times, and loose frequent stools. Denies cough, chest pain, abdominal pain, shortness of breath. States able to swallow liquids, but reports it being painful.   Past Medical History  Diagnosis Date  . Migraines   . Hypertension   . GERD (gastroesophageal reflux disease)     Past Surgical History  Procedure Laterality Date  . Tubal ligation    . Tubal ligation      History reviewed. No pertinent family history.  History  Substance Use Topics  . Smoking status: Never Smoker   . Smokeless tobacco: Not on file  . Alcohol Use: No    OB History   Grav Para Term Preterm Abortions TAB SAB Ect Mult Living                  Review of Systems  Constitutional: Positive for fever and chills.  HENT: Positive for ear pain and sore throat. Negative for congestion, trouble swallowing, neck pain, neck stiffness and voice change.   Respiratory: Negative.   Cardiovascular: Negative.   Gastrointestinal: Negative.   Musculoskeletal: Negative.   Skin: Negative.   Neurological: Positive for headaches. Negative for weakness and numbness.    Allergies  Review of patient's allergies indicates no known allergies.  Home Medications   Current Outpatient Rx  Name  Route   Sig  Dispense  Refill  . amLODipine (NORVASC) 5 MG tablet   Oral   Take 5 mg by mouth daily.         . butalbital-acetaminophen-caffeine (FIORICET, ESGIC) 50-325-40 MG per tablet   Oral   Take 1 tablet by mouth every 6 (six) hours as needed. For headache         . diphenhydramine-acetaminophen (TYLENOL PM EXTRA STRENGTH) 25-500 MG TABS   Oral   Take 2 tablets by mouth at bedtime as needed. For headache         . HYDROcodone-homatropine (HYCODAN) 5-1.5 MG/5ML syrup   Oral   Take 5 mLs by mouth every 6 (six) hours as needed for cough.   120 mL   0   . omeprazole (PRILOSEC) 40 MG capsule   Oral   Take 40 mg by mouth daily.         Marland Kitchen oxyCODONE-acetaminophen (ROXICET) 5-325 MG per tablet   Oral   Take 1 tablet by mouth every 4 (four) hours as needed for pain.   12 tablet   0     BP 145/97  Pulse 97  Temp(Src) 98.1 F (36.7 C) (Oral)  Resp 20  SpO2 100%  Physical Exam  Nursing note and vitals reviewed. Constitutional: She is oriented to person, place, and time. She appears well-developed and well-nourished. No distress.  HENT:  Head: Normocephalic and atraumatic.  Right Ear: Tympanic membrane, external ear and ear  canal normal.  Left Ear: Tympanic membrane, external ear and ear canal normal.  Nose: Nose normal.  Mouth/Throat: Uvula is midline and mucous membranes are normal. No dental abscesses. Oropharyngeal exudate and posterior oropharyngeal erythema present. No tonsillar abscesses.  Eyes: Conjunctivae are normal.  Neck: Neck supple.  Cardiovascular: Normal rate, regular rhythm and normal heart sounds.   Pulmonary/Chest: Effort normal and breath sounds normal. No respiratory distress. She has no wheezes. She has no rales.  Abdominal: Soft. Bowel sounds are normal. She exhibits no distension. There is no tenderness. There is no rebound.  Musculoskeletal: She exhibits no edema.  Lymphadenopathy:    She has cervical adenopathy.  Neurological: She is alert and  oriented to person, place, and time.  Skin: Skin is warm and dry.    ED Course  Procedures (including critical care time)  1. Strep pharyngitis       MDM  Pt with positive strep screen 2 days ago. Here with not improving pain. Received 1.2 million units of bicillin 2 days ago. On exam, tonsils mildly enlarged, exudate present, they are symmetrical. Uvula non edematous, midline. No signs of peritonsillar abscess. No trismus. Pt is able to swallow in ED. Given dilaudid 1mg  IM, decadron IM. Will d/c home with clindamycin, pain medications, follow up.   Filed Vitals:   04/13/12 1115 04/13/12 1247  BP: 145/97 117/78  Pulse: 97 97  Temp: 98.1 F (36.7 C) 98.3 F (36.8 C)  TempSrc: Oral Oral  Resp: 20 16  SpO2: 100% 100%           Lottie Mussel, PA-C 04/13/12 1549

## 2012-04-13 NOTE — ED Notes (Signed)
Patient states she was seen on Monday and diagnosed with strep throat.  Patient states she hasn't improved and is having N/V/D and fever of 101.2.

## 2012-04-13 NOTE — ED Notes (Signed)
PA bedside with pt 

## 2012-04-15 NOTE — ED Provider Notes (Signed)
Medical screening examination/treatment/procedure(s) were performed by non-physician practitioner and as supervising physician I was immediately available for consultation/collaboration.   Laray Anger, DO 04/15/12 1906

## 2012-04-17 ENCOUNTER — Emergency Department (HOSPITAL_BASED_OUTPATIENT_CLINIC_OR_DEPARTMENT_OTHER): Payer: BC Managed Care – PPO

## 2012-04-17 ENCOUNTER — Emergency Department (HOSPITAL_BASED_OUTPATIENT_CLINIC_OR_DEPARTMENT_OTHER)
Admission: EM | Admit: 2012-04-17 | Discharge: 2012-04-17 | Disposition: A | Discharge status: Home / Self Care | Payer: BC Managed Care – PPO | Attending: Emergency Medicine | Admitting: Emergency Medicine

## 2012-04-17 ENCOUNTER — Encounter (HOSPITAL_BASED_OUTPATIENT_CLINIC_OR_DEPARTMENT_OTHER): Payer: Self-pay | Admitting: *Deleted

## 2012-04-17 ENCOUNTER — Other Ambulatory Visit: Payer: Self-pay

## 2012-04-17 DIAGNOSIS — K219 Gastro-esophageal reflux disease without esophagitis: Secondary | ICD-10-CM | POA: Insufficient documentation

## 2012-04-17 DIAGNOSIS — R071 Chest pain on breathing: Secondary | ICD-10-CM | POA: Insufficient documentation

## 2012-04-17 DIAGNOSIS — B349 Viral infection, unspecified: Secondary | ICD-10-CM

## 2012-04-17 DIAGNOSIS — Z8679 Personal history of other diseases of the circulatory system: Secondary | ICD-10-CM | POA: Insufficient documentation

## 2012-04-17 DIAGNOSIS — Z79899 Other long term (current) drug therapy: Secondary | ICD-10-CM | POA: Insufficient documentation

## 2012-04-17 DIAGNOSIS — B9789 Other viral agents as the cause of diseases classified elsewhere: Secondary | ICD-10-CM | POA: Insufficient documentation

## 2012-04-17 DIAGNOSIS — I10 Essential (primary) hypertension: Secondary | ICD-10-CM | POA: Insufficient documentation

## 2012-04-17 DIAGNOSIS — R05 Cough: Secondary | ICD-10-CM | POA: Insufficient documentation

## 2012-04-17 DIAGNOSIS — R109 Unspecified abdominal pain: Secondary | ICD-10-CM | POA: Insufficient documentation

## 2012-04-17 DIAGNOSIS — M549 Dorsalgia, unspecified: Secondary | ICD-10-CM | POA: Insufficient documentation

## 2012-04-17 DIAGNOSIS — R0789 Other chest pain: Secondary | ICD-10-CM

## 2012-04-17 DIAGNOSIS — R0602 Shortness of breath: Secondary | ICD-10-CM | POA: Insufficient documentation

## 2012-04-17 DIAGNOSIS — R059 Cough, unspecified: Secondary | ICD-10-CM | POA: Insufficient documentation

## 2012-04-17 LAB — CBC WITH DIFFERENTIAL/PLATELET
Basophils Relative: 0 % (ref 0–1)
Eosinophils Absolute: 0.2 10*3/uL (ref 0.0–0.7)
Eosinophils Relative: 1 % (ref 0–5)
HCT: 38.9 % (ref 36.0–46.0)
Hemoglobin: 13.3 g/dL (ref 12.0–15.0)
Lymphs Abs: 3.8 10*3/uL (ref 0.7–4.0)
MCH: 28.9 pg (ref 26.0–34.0)
MCHC: 34.2 g/dL (ref 30.0–36.0)
MCV: 84.6 fL (ref 78.0–100.0)
Monocytes Absolute: 1.3 10*3/uL — ABNORMAL HIGH (ref 0.1–1.0)
Monocytes Relative: 9 % (ref 3–12)
Neutrophils Relative %: 64 % (ref 43–77)
RBC: 4.6 MIL/uL (ref 3.87–5.11)

## 2012-04-17 LAB — D-DIMER, QUANTITATIVE: D-Dimer, Quant: <0.27 ug/mL-FEU (ref 0.00–0.48)

## 2012-04-17 LAB — COMPREHENSIVE METABOLIC PANEL
ALT: 26 U/L (ref 0–35)
BUN: 9 mg/dL (ref 6–23)
CO2: 25 mEq/L (ref 19–32)
Calcium: 9.5 mg/dL (ref 8.4–10.5)
GFR calc Af Amer: >90 mL/min (ref >90)
GFR calc non Af Amer: 81 mL/min — ABNORMAL LOW (ref >90)
Glucose, Bld: 95 mg/dL (ref 70–99)
Sodium: 140 mEq/L (ref 135–145)

## 2012-04-17 LAB — URINALYSIS, ROUTINE W REFLEX MICROSCOPIC
Leukocytes, UA: NEGATIVE
Nitrite: NEGATIVE
Protein, ur: NEGATIVE mg/dL
Urobilinogen, UA: 0.2 mg/dL (ref 0.0–1.0)

## 2012-04-17 LAB — TROPONIN I: Troponin I: <0.3 ng/mL (ref <0.30)

## 2012-04-17 MED ORDER — SODIUM CHLORIDE 0.9 % IV BOLUS (SEPSIS)
500.0000 mL | Freq: Once | INTRAVENOUS | Status: AC
  Administered 2012-04-17: 500 mL via INTRAVENOUS

## 2012-04-17 MED ORDER — PROMETHAZINE HCL 25 MG PO TABS: 25.0000 mg | ORAL_TABLET | Freq: Four times a day (QID) | ORAL | Status: DC | PRN

## 2012-04-17 NOTE — ED Notes (Addendum)
Pt states she was seen here Mon and dx'd with strep. Given a Penicillin shot and placed on an antibx. Wed "No better" Switched to Clindimycin. Friday felt much worse and now c/o chest pressure rad into back and diarrhea +nausea. No period x 6 months "I dont know why"

## 2012-04-17 NOTE — ED Notes (Signed)
rx x 1 given for phenergan- d/c home with ride

## 2012-04-17 NOTE — ED Notes (Signed)
Patient transported to Ultrasound 

## 2012-04-17 NOTE — ED Notes (Signed)
Patient transported to X-ray and returned 

## 2012-04-17 NOTE — ED Provider Notes (Signed)
History    This chart was scribed for Gilda Crease, MD scribed by Magnus Sinning. The patient was seen in room MH11/MH11 at 16:11   CSN: [id number]  Arrival date & time 04/17/12  1550     Chief Complaint  Patient presents with  . Chest Pain    (Consider location/radiation/quality/duration/timing/severity/associated sxs/prior treatment) Patient is a 37 y.o. female presenting with chest pain. The history is provided by the patient. No language interpreter was used.  Chest Pain Associated symptoms: abdominal pain, back pain, cough, nausea and shortness of breath   Associated symptoms: not vomiting   Kelli Carroll is a 38 y.o. female who presents to the Emergency Department complaining of one day of constant severe CP, described as heavy sensation with  associated mustard colored diarrhea, nausea, abd pain,decreased appetite, mild SOB, mild cough, and back pain that is localized to mid back and aggravated with movement.  The pt states she had myalgias and strep throat  6 days ago. She says she came here and was given a penicillin shot and d/c with an abx. She says she was not getting better so two days after ED visit, she stopped at [facility]. She reports they switched her abx and she provides that strep and myalgias have resolved.   Past Medical History  Diagnosis Date  . Migraines   . Hypertension   . GERD (gastroesophageal reflux disease)     Past Surgical History  Procedure Laterality Date  . Tubal ligation    . Tubal ligation      History reviewed. No pertinent family history.  History  Substance Use Topics  . Smoking status: Never Smoker   . Smokeless tobacco: Not on file  . Alcohol Use: No    Review of Systems  Constitutional: Positive for appetite change.  HENT: Negative for sore throat.   Respiratory: Positive for cough and shortness of breath.   Cardiovascular: Positive for chest pain.  Gastrointestinal: Positive for nausea, abdominal pain and diarrhea.  Negative for vomiting.  Musculoskeletal: Positive for back pain. Negative for myalgias.  All other systems reviewed and are negative.    Allergies  Review of patient's allergies indicates no known allergies.  Home Medications   Current Outpatient Rx  Name  Route  Sig  Dispense  Refill  . amLODipine (NORVASC) 5 MG tablet   Oral   Take 5 mg by mouth daily.         . butalbital-acetaminophen-caffeine (FIORICET, ESGIC) 50-325-40 MG per tablet   Oral   Take 1 tablet by mouth every 6 (six) hours as needed. For headache         . clindamycin (CLEOCIN) 150 MG capsule   Oral   Take 3 capsules (450 mg total) by mouth 3 (three) times daily.   63 capsule   0   . diphenhydramine-acetaminophen (TYLENOL PM EXTRA STRENGTH) 25-500 MG TABS   Oral   Take 2 tablets by mouth at bedtime as needed. For headache         . ibuprofen (ADVIL,MOTRIN) 800 MG tablet   Oral   Take 1 tablet (800 mg total) by mouth 3 (three) times daily.   21 tablet   0   . omeprazole (PRILOSEC) 40 MG capsule   Oral   Take 40 mg by mouth daily.         Marland Kitchen oxyCODONE-acetaminophen (PERCOCET) 5-325 MG per tablet   Oral   Take 1 tablet by mouth every 4 (four) hours as needed for pain.  20 tablet   0   . oxyCODONE-acetaminophen (ROXICET) 5-325 MG per tablet   Oral   Take 1 tablet by mouth every 4 (four) hours as needed for pain.   12 tablet   0     BP 123/74  Pulse 98  Temp(Src) 97.7 F (36.5 C) (Oral)  Resp 20  Ht 5\' 3"  (1.6 m)  Wt 240 lb (108.863 kg)  BMI 42.52 kg/m2  SpO2 99%  Physical Exam  Nursing note and vitals reviewed. Constitutional: She is oriented to person, place, and time. She appears well-developed and well-nourished. No distress.  HENT:  Head: Normocephalic and atraumatic.  Eyes: Conjunctivae and EOM are normal.  Neck: Neck supple. No tracheal deviation present.  Cardiovascular: Normal rate, regular rhythm and normal heart sounds.   Pulmonary/Chest: Effort normal and breath  sounds normal. No respiratory distress. She has no wheezes. She has no rales.  Abdominal: Soft. Bowel sounds are normal. She exhibits no distension. There is tenderness.  Generalized abd tenderness  Musculoskeletal: Normal range of motion.  Neurological: She is alert and oriented to person, place, and time. She has normal strength. She displays normal reflexes. No sensory deficit.  Skin: Skin is warm and dry.  Psychiatric: She has a normal mood and affect. Her behavior is normal.    ED Course  Procedures (including critical care time) DIAGNOSTIC STUDIES: Oxygen Saturation is 99% on room air, normal by my interpretation.    EKG:  Date: 04/17/2012  Rate: 97  Rhythm: normal sinus rhythm  QRS Axis: normal  Intervals: normal  ST/T Wave abnormalities: normal  Conduction Disutrbances:none  Narrative Interpretation:   Old EKG Reviewed: unchanged    COORDINATION OF CARE: 16:13: Physical exam performed and intent provided to perform lab work-up. The patient is agreeable.   Labs Reviewed  CBC WITH DIFFERENTIAL - Abnormal; Notable for the following:    WBC 14.6 (*)    Neutro Abs 9.3 (*)    Monocytes Absolute 1.3 (*)    All other components within normal limits  COMPREHENSIVE METABOLIC PANEL - Abnormal; Notable for the following:    Potassium 3.3 (*)    GFR calc non Af Amer 81 (*)    All other components within normal limits  URINALYSIS, ROUTINE W REFLEX MICROSCOPIC - Abnormal; Notable for the following:    APPearance CLOUDY (*)    Hgb urine dipstick MODERATE (*)    All other components within normal limits  URINE MICROSCOPIC-ADD ON - Abnormal; Notable for the following:    Squamous Epithelial / LPF FEW (*)    All other components within normal limits  TROPONIN I  D-DIMER, QUANTITATIVE  PREGNANCY, URINE   Dg Chest 2 View  04/17/2012  *RADIOLOGY REPORT*  Clinical Data: Chest pressure with nausea and diarrhea.  Recent antibiotic therapy.  CHEST - 2 VIEW  Comparison: 02/22/2012  radiographs.  Findings: The heart size and mediastinal contours are normal. The lungs are clear. There is no pleural effusion or pneumothorax. No acute osseous findings are identified.  Telemetry leads overlie the chest.  IMPRESSION: No active cardiopulmonary process.   Original Report Authenticated By: Carey Bullocks, M.D.    US Abdomen Complete  04/17/2012  *RADIOLOGY REPORT*  Clinical Data:  Upper abdominal pain with nausea.  History of tubal ligation.  COMPLETE ABDOMINAL ULTRASOUND  Comparison:  Abdominal ultrasound 09/01/2010.  Findings:  Study is mildly limited by body habitus and bowel gas.  Gallbladder: Well distended without wall thickening, stones or pericholecystic fluid. Negative sonographic Murphy's  sign.  Common bile duct:   Normal in caliber without filling defects.  Liver:  The liver is mildly heterogeneous in echotexture without focal abnormality.  IVC:  Suboptimally visualized but grossly unremarkable.  Pancreas:  Visualized portions appear unremarkable.Portions of the pancreas are obscured by bowel gas.  Spleen:  Visualized portions appear unremarkable.  Right Kidney:   The renal cortical thickness and echogenicity are preserved.  There is no hydronephrosis or focal abnormality. Renal length is 12.0 cm.  Left Kidney:   The renal cortical thickness and echogenicity are preserved.  There is no hydronephrosis or focal abnormality. Renal length is 12.7 cm.  Abdominal aorta:  Visualized portions appear unremarkable.  IMPRESSION: No significant change or acute findings identified.  The liver is mildly heterogeneous in echotexture without focal abnormality.   Original Report Authenticated By: Carey Bullocks, M.D.           Diagnosis: 1. Chest wall pain 2. Viral syndrome    MDM  Patient presents to ER with complaints of chest pain. She is expressing mild shortness of breath and pain across bilateral anterior chest wall area and pain into the back. Additionally, patient is complaining of  nausea without vomiting and diarrhea. Diarrhea might be secondary to the antibiotics she has been on. Her sore throat is resolved and I do not think she needs to continue the clindamycin.  Chest x-ray was performed and she does not have pneumonia. EKG did not show any evidence of ischemia or infarct. She has had the pain for more than one day without any positive findings on cardiac workup. Troponin was negative. D-dimer was also negative.  She has had GI symptoms including abdominal discomfort and pain into the back, ultrasound was performed. Ultrasound did not show any abnormalities, normal gallbladder.  Patient is feeling better her IV hydration and Zofran. She'll be discharged with continued symptomatic treatment.  I personally performed the services described in this documentation, which was scribed in my presence. The recorded information has been reviewed and is accurate.         Gilda Crease, MD 04/17/12 [phone]

## 2012-05-02 ENCOUNTER — Other Ambulatory Visit (HOSPITAL_COMMUNITY)
Admission: RE | Admit: 2012-05-02 | Discharge: 2012-05-02 | Disposition: A | Discharge status: Home / Self Care | Payer: BC Managed Care – PPO | Source: Ambulatory Visit | Attending: Family Medicine | Admitting: Family Medicine

## 2012-05-02 ENCOUNTER — Other Ambulatory Visit: Payer: Self-pay | Admitting: Family Medicine

## 2012-05-02 DIAGNOSIS — Z01419 Encounter for gynecological examination (general) (routine) without abnormal findings: Secondary | ICD-10-CM | POA: Insufficient documentation

## 2013-03-14 ENCOUNTER — Encounter (HOSPITAL_COMMUNITY): Payer: Self-pay | Admitting: Emergency Medicine

## 2013-03-14 ENCOUNTER — Emergency Department (HOSPITAL_COMMUNITY): Payer: Commercial Managed Care - PPO

## 2013-03-14 ENCOUNTER — Emergency Department (HOSPITAL_COMMUNITY)
Admission: EM | Admit: 2013-03-14 | Discharge: 2013-03-14 | Disposition: A | Discharge status: Home / Self Care | Payer: Commercial Managed Care - PPO | Attending: Emergency Medicine | Admitting: Emergency Medicine

## 2013-03-14 DIAGNOSIS — Z79899 Other long term (current) drug therapy: Secondary | ICD-10-CM | POA: Insufficient documentation

## 2013-03-14 DIAGNOSIS — J329 Chronic sinusitis, unspecified: Secondary | ICD-10-CM

## 2013-03-14 DIAGNOSIS — K219 Gastro-esophageal reflux disease without esophagitis: Secondary | ICD-10-CM | POA: Insufficient documentation

## 2013-03-14 DIAGNOSIS — I10 Essential (primary) hypertension: Secondary | ICD-10-CM | POA: Insufficient documentation

## 2013-03-14 LAB — RAPID STREP SCREEN (MED CTR MEBANE ONLY): STREPTOCOCCUS, GROUP A SCREEN (DIRECT): NEGATIVE

## 2013-03-14 MED ORDER — GUAIFENESIN-CODEINE 100-10 MG/5ML PO SOLN: 5.0000 mL | Freq: Three times a day (TID) | ORAL | Status: AC | PRN

## 2013-03-14 MED ORDER — AMOXICILLIN 500 MG PO CAPS: 500.0000 mg | ORAL_CAPSULE | Freq: Three times a day (TID) | ORAL | Status: DC

## 2013-03-14 NOTE — ED Provider Notes (Signed)
CSN: [id number]     Arrival date & time 03/14/13  1116 History   First MD Initiated Contact with Patient 03/14/13 1332     Chief Complaint  Patient presents with  . Sore Throat     (Consider location/radiation/quality/duration/timing/severity/associated sxs/prior Treatment) HPI Comments: Patient presents emergency department with chief complaint of sore throat, cough, nasal congestion, and subjective fever. She states the symptoms started on Friday. She is tried taking Mucinex with no relief. There no aggravating or alleviating factors. She denies chest pain or shortness of breath. Denies any nausea or vomiting. She denies any sick contacts.  The history is provided by the patient. No language interpreter was used.    Past Medical History  Diagnosis Date  . Migraines   . Hypertension   . GERD (gastroesophageal reflux disease)    Past Surgical History  Procedure Laterality Date  . Tubal ligation    . Tubal ligation     History reviewed. No pertinent family history. History  Substance Use Topics  . Smoking status: Never Smoker   . Smokeless tobacco: Not on file  . Alcohol Use: No   OB History   Grav Para Term Preterm Abortions TAB SAB Ect Mult Living                 Review of Systems  All other systems reviewed and are negative.      Allergies  Review of patient's allergies indicates no known allergies.  Home Medications   Current Outpatient Rx  Name  Route  Sig  Dispense  Refill  . amLODipine (NORVASC) 5 MG tablet   Oral   Take 5 mg by mouth daily.         . butalbital-acetaminophen-caffeine (FIORICET, ESGIC) 50-325-40 MG per tablet   Oral   Take 1 tablet by mouth every 6 (six) hours as needed. For headache         . guaiFENesin (MUCINEX) 600 MG 12 hr tablet   Oral   Take 600 mg by mouth 2 (two) times daily as needed (cough).         . Multiple Vitamins-Minerals (QC WOMENS DAILY MULTIVITAMIN) TABS   Oral   Take 1 tablet by mouth daily.          Marland Kitchen omeprazole (PRILOSEC) 40 MG capsule   Oral   Take 40 mg by mouth daily.         . Pseudoephedrine-APAP-DM (DAYQUIL MULTI-SYMPTOM COLD/FLU PO)   Oral   Take 15 mLs by mouth every 8 (eight) hours as needed (cold).         Marland Kitchen amoxicillin (AMOXIL) 500 MG capsule   Oral   Take 1 capsule (500 mg total) by mouth 3 (three) times daily.   30 capsule   0   . guaiFENesin-codeine 100-10 MG/5ML syrup   Oral   Take 5 mLs by mouth 3 (three) times daily as needed for cough.   120 mL   0    BP 143/99  Pulse 113  Temp(Src) 98.8 F (37.1 C) (Oral)  Resp 20  SpO2 98% Physical Exam  Nursing note and vitals reviewed. Constitutional: She is oriented to person, place, and time. She appears well-developed and well-nourished.  HENT:  Head: Normocephalic and atraumatic.  Right Ear: External ear normal.  Left Ear: External ear normal.  Mouth/Throat: Oropharynx is clear and moist. No oropharyngeal exudate.  Swollen, erythematous turbinates, maxillary sinuses tender to palpation  Eyes: Conjunctivae and EOM are normal. Pupils are equal, round,  and reactive to light.  Neck: Normal range of motion. Neck supple.  Cardiovascular: Normal rate, regular rhythm and normal heart sounds.   Regular on my exam  Pulmonary/Chest: Effort normal and breath sounds normal. No respiratory distress. She has no wheezes. She has no rales. She exhibits no tenderness.  Abdominal: Soft. Bowel sounds are normal.  Musculoskeletal: Normal range of motion.  Neurological: She is alert and oriented to person, place, and time.  Skin: Skin is warm and dry.  Psychiatric: She has a normal mood and affect. Her behavior is normal. Judgment and thought content normal.    ED Course  Procedures (including critical care time) Labs Review Labs Reviewed  RAPID STREP SCREEN  CULTURE, GROUP A STREP   Imaging Review Dg Chest 2 View  03/14/2013   CLINICAL DATA:  Cough and congestion and sore throat  EXAM: CHEST  2 VIEW   COMPARISON:  DG CHEST 2 VIEW dated 04/17/2012  FINDINGS: The lungs are well-expanded and clear. The cardiopericardial silhouette is normal in size. The pulmonary vascularity is not engorged. The mediastinum is normal in width. There is no pleural effusion or pneumothorax. The observed portions of the bony thorax exhibit no acute abnormalities. There are bilateral cervical ribs.  IMPRESSION: There is no evidence of pneumonia nor other acute cardiopulmonary disease.   Electronically Signed   By: David  Martinique   On: 03/14/2013 11:56    EKG Interpretation   None       MDM   Final diagnoses:  Sinusitis         Montine Circle, PA-C 03/14/13 1353

## 2013-03-14 NOTE — ED Notes (Signed)
Pt with congestion, cough, sore throat, fever and now neck pain since Friday.

## 2013-03-14 NOTE — Discharge Instructions (Signed)

## 2013-03-15 NOTE — ED Provider Notes (Signed)
Medical screening examination/treatment/procedure(s) were performed by non-physician practitioner and as supervising physician I was immediately available for consultation/collaboration.  EKG Interpretation   None        Jasper Riling. Alvino Chapel, MD 03/15/13 [phone]

## 2013-03-16 LAB — CULTURE, GROUP A STREP

## 2013-05-24 IMAGING — CR DG CHEST 2V
2 series · 2 of 2 positions shown · non-contrast
Comparison: None.

CLINICAL DATA: Cough and chest pain.  Sore throat.

CHEST - 2 VIEW

[w chest pa]
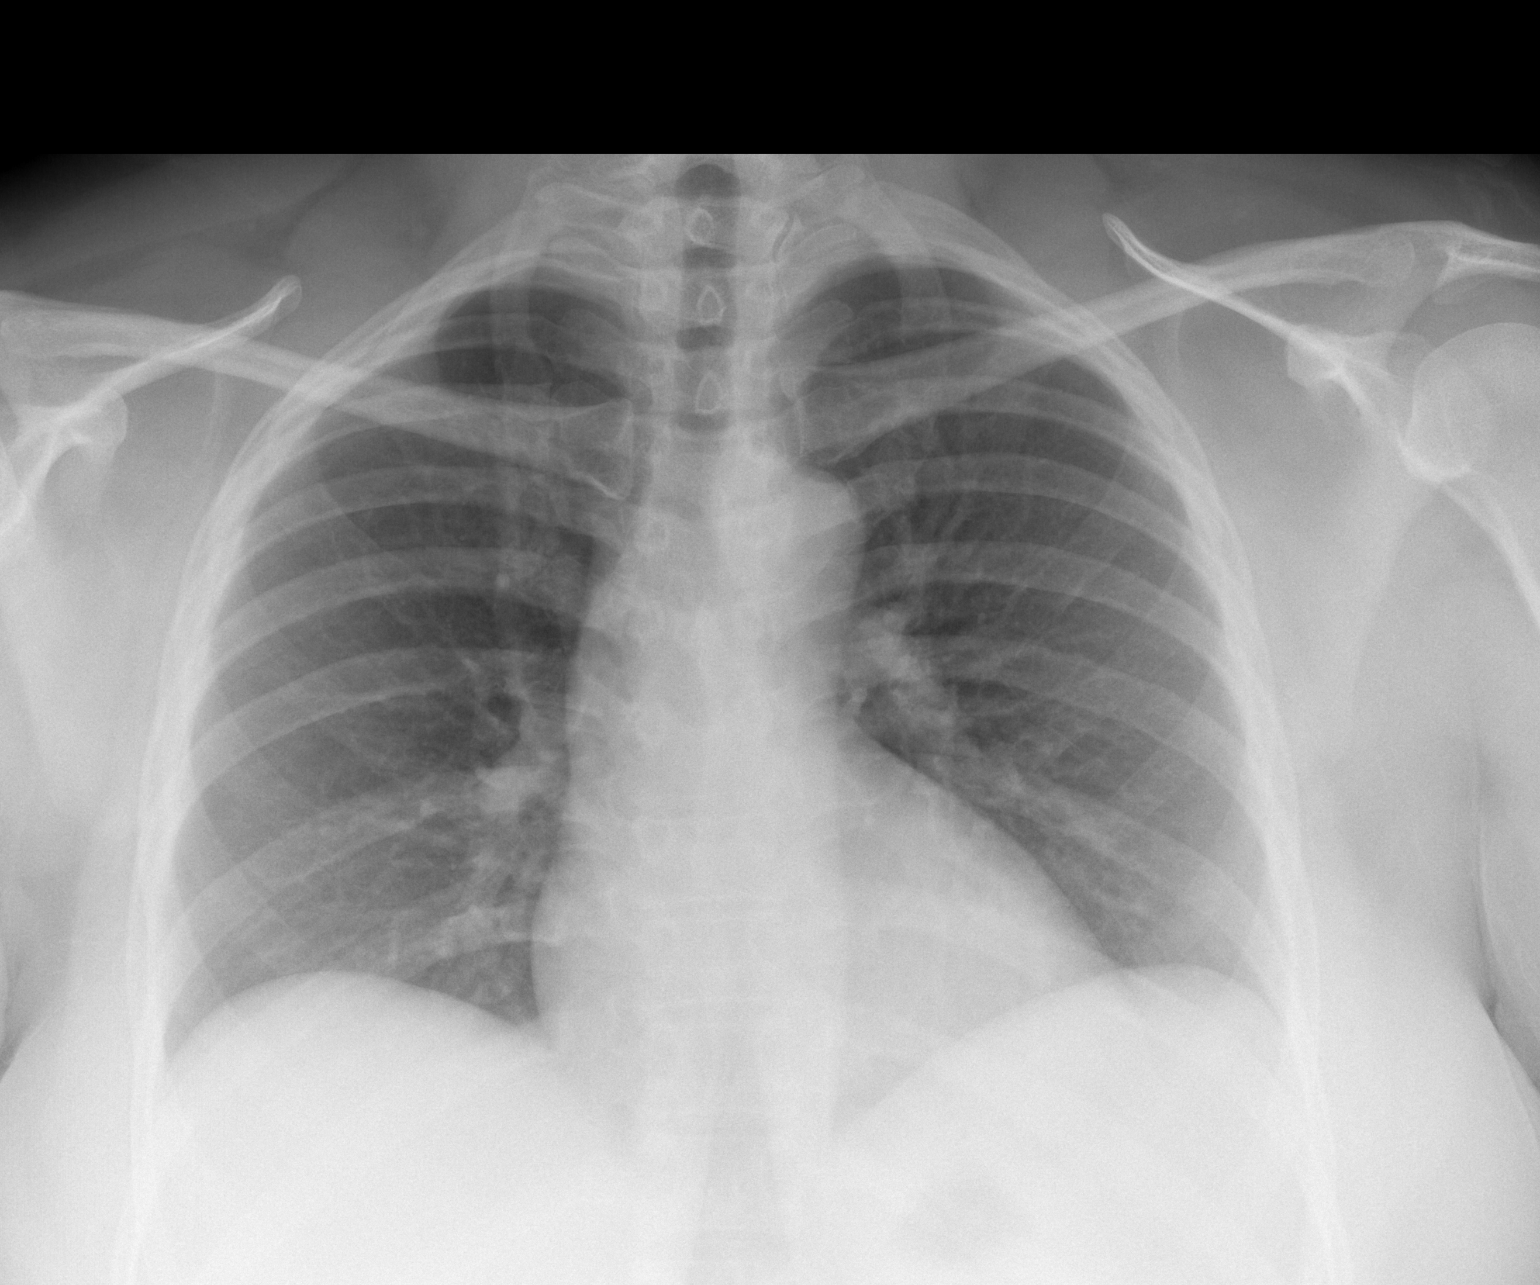

[w chest lat]
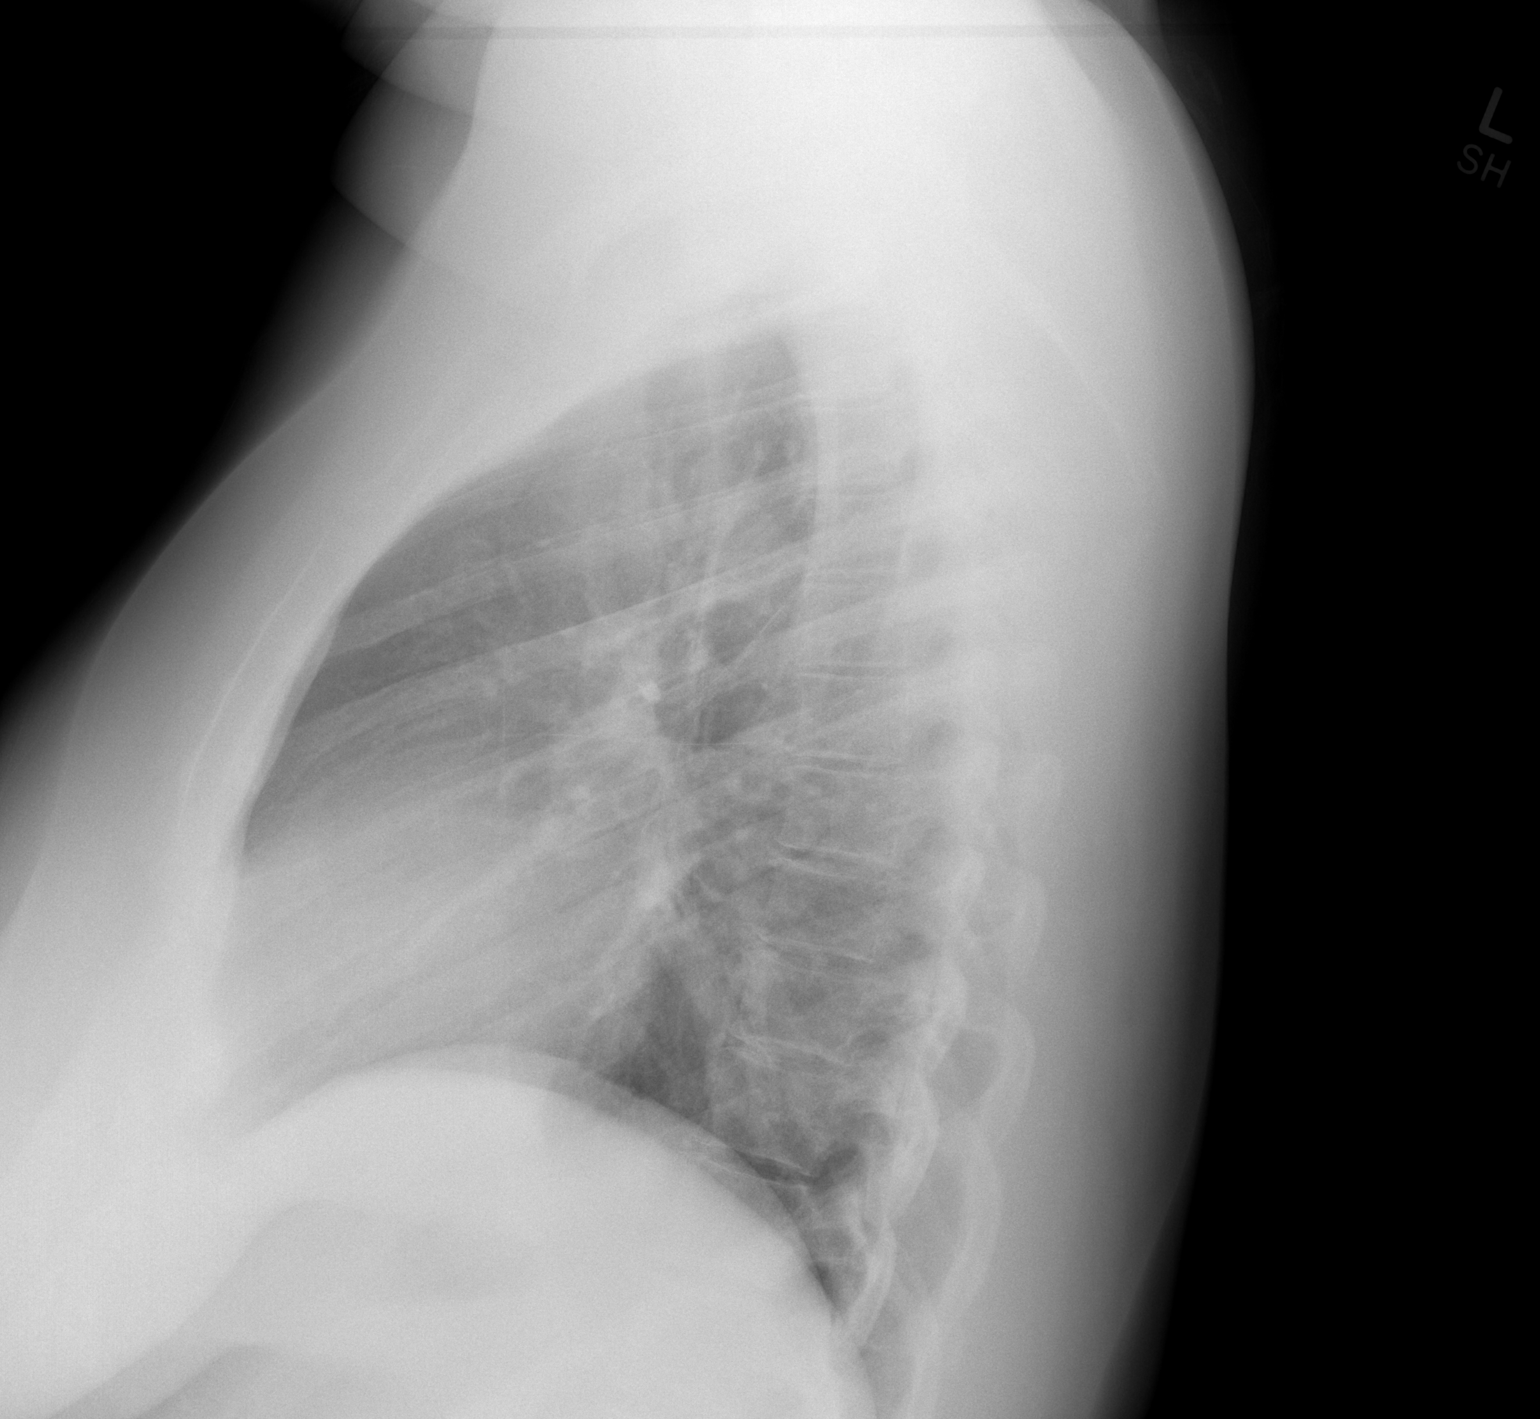

[2 of 2 positions shown; findings below may reference images not displayed]

FINDINGS: The lungs are clear without focal consolidation, edema,
effusion or pneumothorax.  Cardiopericardial silhouette is within
normal limits for size.  Imaged bony structures of the thorax are
intact.
IMPRESSION: No acute findings.

## 2013-08-10 ENCOUNTER — Ambulatory Visit: Payer: 59 | Admitting: Obstetrics & Gynecology

## 2014-01-15 ENCOUNTER — Encounter: Payer: Self-pay | Admitting: *Deleted

## 2014-01-16 ENCOUNTER — Encounter: Payer: Self-pay | Admitting: Obstetrics & Gynecology

## 2017-05-08 ENCOUNTER — Encounter (HOSPITAL_BASED_OUTPATIENT_CLINIC_OR_DEPARTMENT_OTHER): Payer: Self-pay | Admitting: Emergency Medicine

## 2017-05-08 ENCOUNTER — Emergency Department (HOSPITAL_BASED_OUTPATIENT_CLINIC_OR_DEPARTMENT_OTHER)
Admission: EM | Admit: 2017-05-08 | Discharge: 2017-05-08 | Disposition: A | Discharge status: Home / Self Care | Payer: Medicaid Other | Attending: Emergency Medicine | Admitting: Emergency Medicine

## 2017-05-08 ENCOUNTER — Other Ambulatory Visit: Payer: Self-pay

## 2017-05-08 DIAGNOSIS — J029 Acute pharyngitis, unspecified: Secondary | ICD-10-CM | POA: Insufficient documentation

## 2017-05-08 DIAGNOSIS — R070 Pain in throat: Secondary | ICD-10-CM | POA: Diagnosis present

## 2017-05-08 DIAGNOSIS — Z79899 Other long term (current) drug therapy: Secondary | ICD-10-CM | POA: Diagnosis not present

## 2017-05-08 DIAGNOSIS — I10 Essential (primary) hypertension: Secondary | ICD-10-CM | POA: Insufficient documentation

## 2017-05-08 LAB — RAPID STREP SCREEN (MED CTR MEBANE ONLY): Streptococcus, Group A Screen (Direct): NEGATIVE

## 2017-05-08 MED ORDER — KETOROLAC TROMETHAMINE 15 MG/ML IJ SOLN
15.0000 mg | Freq: Once | INTRAMUSCULAR | Status: AC
  Administered 2017-05-08: 15 mg via INTRAMUSCULAR
  Filled 2017-05-08: qty 1

## 2017-05-08 MED ORDER — DEXAMETHASONE SODIUM PHOSPHATE 10 MG/ML IJ SOLN
10.0000 mg | Freq: Once | INTRAMUSCULAR | Status: AC
  Administered 2017-05-08: 10 mg via INTRAMUSCULAR
  Filled 2017-05-08: qty 1

## 2017-05-08 NOTE — ED Triage Notes (Signed)
Sore throat x 3 days. Reports a sick contact with strep throat.

## 2017-05-08 NOTE — ED Provider Notes (Signed)
[place] EMERGENCY DEPARTMENT Provider Note   CSN: [id number] Arrival date & time: 05/08/17  1243     History   Chief Complaint Chief Complaint  Patient presents with  . Sore Throat    HPI Kelli Carroll is a 42 y.o. female.  Patient presents with complaint of sore throat for the past 3 days.  She has been exposed to family members who have been diagnosed with strep throat.  She denies associated fevers.  She has had some ear pain, recent ear infection for which she took amoxicillin, finished course a week ago.  She has been able to eat and drink stating that it is easier to drink cold liquids.  No cough or chest pain.  No pain with movement of the neck.  Onset of symptoms acute.  Course is constant.  Nothing makes symptoms better.     Past Medical History:  Diagnosis Date  . GERD (gastroesophageal reflux disease)   . Hypertension   . Migraines     There are no active problems to display for this patient.   Past Surgical History:  Procedure Laterality Date  . TUBAL LIGATION    . TUBAL LIGATION       OB History   None      Home Medications    Prior to Admission medications   Medication Sig Start Date End Date Taking? Authorizing Provider  amLODipine (NORVASC) 5 MG tablet Take 5 mg by mouth daily.    [provider]  amoxicillin (AMOXIL) 500 MG capsule Take 1 capsule (500 mg total) by mouth 3 (three) times daily. 03/14/13   Montine Circle, PA-C  butalbital-acetaminophen-caffeine (FIORICET, ESGIC) [id number] MG per tablet Take 1 tablet by mouth every 6 (six) hours as needed. For headache    [provider]  guaiFENesin (MUCINEX) 600 MG 12 hr tablet Take 600 mg by mouth 2 (two) times daily as needed (cough).    [provider]  guaiFENesin-codeine 100-10 MG/5ML syrup Take 5 mLs by mouth 3 (three) times daily as needed for cough. 03/14/13   Montine Circle, PA-C  Multiple Vitamins-Minerals (QC WOMENS DAILY MULTIVITAMIN) TABS  Take 1 tablet by mouth daily.    [provider]  omeprazole (PRILOSEC) 40 MG capsule Take 40 mg by mouth daily.    [provider]  Pseudoephedrine-APAP-DM (DAYQUIL MULTI-SYMPTOM COLD/FLU PO) Take 15 mLs by mouth every 8 (eight) hours as needed (cold).    [provider]    Family History No family history on file.  Social History Social History   Tobacco Use  . Smoking status: Never Smoker  . Smokeless tobacco: Never Used  Substance Use Topics  . Alcohol use: No  . Drug use: No     Allergies   Patient has no known allergies.   Review of Systems Review of Systems  Constitutional: Negative for chills, fatigue and fever.  HENT: Positive for ear pain and sore throat. Negative for congestion, rhinorrhea and sinus pressure.   Eyes: Negative for redness.  Respiratory: Negative for cough and wheezing.   Gastrointestinal: Negative for abdominal pain, diarrhea, nausea and vomiting.  Genitourinary: Negative for dysuria.  Musculoskeletal: Negative for myalgias and neck stiffness.  Skin: Negative for rash.  Neurological: Negative for headaches.  Hematological: Negative for adenopathy.     Physical Exam Updated Vital Signs BP (!) 144/100 (BP Location: Left Arm)   Pulse 96   Temp 98.2 F (36.8 C) (Oral)   Resp 18   Ht 5\' 2"  (  1.575 m)   Wt 117.9 kg (260 lb)   SpO2 100%   BMI 47.55 kg/m   Physical Exam  Constitutional: She appears well-developed and well-nourished.  HENT:  Head: Normocephalic and atraumatic.  Right Ear: Tympanic membrane, external ear and ear canal normal.  Left Ear: Tympanic membrane, external ear and ear canal normal.  Nose: Nose normal. No mucosal edema or rhinorrhea.  Mouth/Throat: Uvula is midline and mucous membranes are normal. Mucous membranes are not dry. No oral lesions. No trismus in the jaw. No uvula swelling. Posterior oropharyngeal erythema present. No oropharyngeal exudate, posterior oropharyngeal edema or  tonsillar abscesses. Tonsils are 3+ on the right. Tonsils are 3+ on the left. No tonsillar exudate.  Eyes: Conjunctivae are normal. Right eye exhibits no discharge. Left eye exhibits no discharge.  Neck: Normal range of motion. Neck supple.  Cardiovascular: Normal rate, regular rhythm and normal heart sounds.  Pulmonary/Chest: Effort normal and breath sounds normal. No respiratory distress. She has no wheezes. She has no rales.  Abdominal: Soft. There is no tenderness.  Lymphadenopathy:    She has no cervical adenopathy.  Neurological: She is alert.  Skin: Skin is warm and dry.  Psychiatric: She has a normal mood and affect.  Nursing note and vitals reviewed.    ED Treatments / Results  Labs (all labs ordered are listed, but only abnormal results are displayed) Labs Reviewed  RAPID STREP SCREEN ([facility] ONLY)  CULTURE, GROUP A STREP [facility])    EKG None  Radiology No results found.  Procedures Procedures (including critical care time)  Medications Ordered in ED Medications  ketorolac (TORADOL) 15 MG/ML injection 15 mg (has no administration in time range)  dexamethasone (DECADRON) injection 10 mg (has no administration in time range)     Initial Impression / Assessment and Plan / ED Course  I have reviewed the triage vital signs and the nursing notes.  Pertinent labs & imaging results that were available during my care of the patient were reviewed by me and considered in my medical decision making (see chart for details).     Patient seen and examined.   Vital signs reviewed and are as follows: BP (!) 144/100 (BP Location: Left Arm)   Pulse 96   Temp 98.2 F (36.8 C) (Oral)   Resp 18   Ht 5\' 2"  (1.575 m)   Wt 117.9 kg (260 lb)   SpO2 100%   BMI 47.55 kg/m   Patient informed of negative strep results.  Will give dose of IM Toradol and Decadron to help ameliorate symptoms.  No obvious contra indications to this.  Patient informed that strep culture will be  pending.  Encouraged return with worsening, difficulty breathing or swallowing, any symptoms or other concerns.  Patient verbalizes understanding agrees with plan.   Final Clinical Impressions(s) / ED Diagnoses   Final diagnoses:  Pharyngitis, unspecified etiology   Patient with sore throat, negative strep screen.  No peritonsillar abscess.  Full range of motion in neck and I have low suspicion for deep space infection.  Antibiotics not indicated.  Symptomatic treatment given.   ED Discharge Orders    None       Carlisle Cater, Hershal Coria 05/08/17 1528    Cardama, Grayce Sessions, MD 05/08/17 [phone]

## 2017-05-08 NOTE — Discharge Instructions (Signed)
Please read and follow all provided instructions.  Your diagnoses today include:  1. Pharyngitis, unspecified etiology     Tests performed today include:  Strep test: was negative for strep throat  Strep culture: you will be notified if this comes back positive  Vital signs. See below for your results today.   Medications prescribed:   None  Home care instructions:  Please read the educational materials provided and follow any instructions contained in this packet.  Follow-up instructions: Please follow-up with your primary care provider as needed for further evaluation of your symptoms.  Return instructions:   Please return to the Emergency Department if you experience worsening symptoms.   Return if you have worsening problems swallowing, your neck becomes swollen, you cannot swallow your saliva or your voice becomes muffled.   Return with high persistent fever, persistent vomiting, or if you have trouble breathing.   Please return if you have any other emergent concerns.  Additional Information:  Your vital signs today were: BP (!) 144/100 (BP Location: Left Arm)    Pulse 96    Temp 98.2 F (36.8 C) (Oral)    Resp 18    Ht 5\' 2"  (1.575 m)    Wt 117.9 kg (260 lb)    SpO2 100%    BMI 47.55 kg/m  If your blood pressure (BP) was elevated above 135/85 this visit, please have this repeated by your doctor within one month. --------------

## 2017-05-10 LAB — CULTURE, GROUP A STREP (THRC)

## 2017-06-13 ENCOUNTER — Other Ambulatory Visit: Payer: Self-pay

## 2017-06-13 ENCOUNTER — Encounter (HOSPITAL_BASED_OUTPATIENT_CLINIC_OR_DEPARTMENT_OTHER): Payer: Self-pay | Admitting: Emergency Medicine

## 2017-06-13 ENCOUNTER — Emergency Department (HOSPITAL_BASED_OUTPATIENT_CLINIC_OR_DEPARTMENT_OTHER)
Admission: EM | Admit: 2017-06-13 | Discharge: 2017-06-13 | Disposition: A | Discharge status: Home / Self Care | Payer: Medicaid Other | Attending: Emergency Medicine | Admitting: Emergency Medicine

## 2017-06-13 ENCOUNTER — Emergency Department (HOSPITAL_BASED_OUTPATIENT_CLINIC_OR_DEPARTMENT_OTHER): Payer: Medicaid Other

## 2017-06-13 DIAGNOSIS — I10 Essential (primary) hypertension: Secondary | ICD-10-CM | POA: Insufficient documentation

## 2017-06-13 DIAGNOSIS — Z79899 Other long term (current) drug therapy: Secondary | ICD-10-CM | POA: Insufficient documentation

## 2017-06-13 DIAGNOSIS — E876 Hypokalemia: Secondary | ICD-10-CM | POA: Insufficient documentation

## 2017-06-13 DIAGNOSIS — R0789 Other chest pain: Secondary | ICD-10-CM | POA: Diagnosis not present

## 2017-06-13 DIAGNOSIS — R079 Chest pain, unspecified: Secondary | ICD-10-CM | POA: Diagnosis present

## 2017-06-13 LAB — BASIC METABOLIC PANEL
ANION GAP: 11 (ref 5–15)
BUN: 13 mg/dL (ref 6–20)
CALCIUM: 9.1 mg/dL (ref 8.9–10.3)
CHLORIDE: 99 mmol/L — AB (ref 101–111)
CO2: 28 mmol/L (ref 22–32)
Creatinine, Ser: 0.98 mg/dL (ref 0.44–1.00)
GFR calc Af Amer: >60 mL/min (ref >60)
GFR calc non Af Amer: >60 mL/min (ref >60)
GLUCOSE: 113 mg/dL — AB (ref 65–99)
POTASSIUM: 2.7 mmol/L — AB (ref 3.5–5.1)
Sodium: 138 mmol/L (ref 135–145)

## 2017-06-13 LAB — CBC
HEMATOCRIT: 39 % (ref 36.0–46.0)
HEMOGLOBIN: 13.2 g/dL (ref 12.0–15.0)
MCH: 29.6 pg (ref 26.0–34.0)
MCHC: 33.8 g/dL (ref 30.0–36.0)
MCV: 87.4 fL (ref 78.0–100.0)
Platelets: 310 10*3/uL (ref 150–400)
RBC: 4.46 MIL/uL (ref 3.87–5.11)
RDW: 12.7 % (ref 11.5–15.5)
WBC: 8.9 10*3/uL (ref 4.0–10.5)

## 2017-06-13 LAB — TROPONIN I: Troponin I: <0.03 ng/mL (ref <0.03)

## 2017-06-13 LAB — MAGNESIUM: Magnesium: 1.6 mg/dL — ABNORMAL LOW (ref 1.7–2.4)

## 2017-06-13 LAB — D-DIMER, QUANTITATIVE: D-Dimer, Quant: <0.27 ug/mL-FEU (ref 0.00–0.50)

## 2017-06-13 MED ORDER — POTASSIUM CHLORIDE CRYS ER 20 MEQ PO TBCR
40.0000 meq | EXTENDED_RELEASE_TABLET | Freq: Once | ORAL | Status: AC
  Administered 2017-06-13: 40 meq via ORAL
  Filled 2017-06-13: qty 2

## 2017-06-13 MED ORDER — POTASSIUM CHLORIDE ER 10 MEQ PO TBCR: 10.0000 meq | EXTENDED_RELEASE_TABLET | Freq: Every day | ORAL | 0 refills | Status: AC

## 2017-06-13 MED ORDER — POTASSIUM CHLORIDE 10 MEQ/100ML IV SOLN
10.0000 meq | INTRAVENOUS | Status: AC
  Administered 2017-06-13 (×2): 10 meq via INTRAVENOUS
  Filled 2017-06-13 (×2): qty 100

## 2017-06-13 MED ORDER — NAPROXEN 500 MG PO TABS: 500.0000 mg | ORAL_TABLET | Freq: Two times a day (BID) | ORAL | 0 refills | Status: AC

## 2017-06-13 MED ORDER — KETOROLAC TROMETHAMINE 30 MG/ML IJ SOLN
30.0000 mg | Freq: Once | INTRAMUSCULAR | Status: AC
  Administered 2017-06-13: 30 mg via INTRAVENOUS
  Filled 2017-06-13: qty 1

## 2017-06-13 MED ORDER — SODIUM CHLORIDE 0.9 % IV BOLUS
500.0000 mL | Freq: Once | INTRAVENOUS | Status: AC
  Administered 2017-06-13: 500 mL via INTRAVENOUS

## 2017-06-13 NOTE — ED Triage Notes (Signed)
Patient states that states that she came home from work with pain to her right chest, rib cage and shoulder into her right neck. - Denies any Smoking, SOB or long trips

## 2017-06-13 NOTE — Discharge Instructions (Signed)
Begin taking potassium once daily as prescribed.  Take Naprosyn twice daily as prescribed for your chest pain.  Do not combine this with ibuprofen, only take 1 or the other.  You can use ice and heat alternating 20 minutes on, 20 minutes off.  Please follow-up with your doctor and let them know about your low potassium today and that your fluid pill may need to be changed or they may need to continue your potassium supplementation.  Please return to the emergency department if you develop any new or worsening symptoms.

## 2017-06-13 NOTE — ED Notes (Signed)
Date and time results received: 06/13/17 1438 Test: potassium Critical Value: 2.7 Name of Provider Notified: Tyrone Nine Orders Received? Or Actions Taken?: no orders given

## 2017-06-13 NOTE — ED Provider Notes (Signed)
[place] EMERGENCY DEPARTMENT Provider Note   CSN: [id number] Arrival date & time: 06/13/17  1259     History   Chief Complaint Chief Complaint  Patient presents with  . Chest Pain    HPI Kelli Carroll is a 42 y.o. female with history of hypertension, migraines, GERD who presents with a 1 day history of right-sided chest pain.  Her pain is sharp and pleuritic.  She has had some shortness of breath.  The pain does not radiate, although she does have pain wrapping around her back.  She reports prior to this, she was having some right-sided neck pain.  She reports having an upper respiratory infection 1 month ago.  She denies any recent long trips, surgeries, known cancer, history of blood clots, exogenous estrogen use, new leg pain or swelling. She took ibuprofen yesterday without significant relief.   HPI  Past Medical History:  Diagnosis Date  . GERD (gastroesophageal reflux disease)   . Hypertension   . Migraines     There are no active problems to display for this patient.   Past Surgical History:  Procedure Laterality Date  . TUBAL LIGATION    . TUBAL LIGATION       OB History   None      Home Medications    Prior to Admission medications   Medication Sig Start Date End Date Taking? Authorizing Provider  amLODipine (NORVASC) 5 MG tablet Take 5 mg by mouth daily.    [provider]  amoxicillin (AMOXIL) 500 MG capsule Take 1 capsule (500 mg total) by mouth 3 (three) times daily. 03/14/13   Montine Circle, PA-C  butalbital-acetaminophen-caffeine (FIORICET, ESGIC) [id number] MG per tablet Take 1 tablet by mouth every 6 (six) hours as needed. For headache    [provider]  guaiFENesin (MUCINEX) 600 MG 12 hr tablet Take 600 mg by mouth 2 (two) times daily as needed (cough).    [provider]  guaiFENesin-codeine 100-10 MG/5ML syrup Take 5 mLs by mouth 3 (three) times daily as needed for cough. 03/14/13   Montine Circle,  PA-C  Multiple Vitamins-Minerals (QC WOMENS DAILY MULTIVITAMIN) TABS Take 1 tablet by mouth daily.    [provider]  naproxen (NAPROSYN) 500 MG tablet Take 1 tablet (500 mg total) by mouth 2 (two) times daily. 06/13/17   Tracy Gerken, Bea Graff, PA-C  omeprazole (PRILOSEC) 40 MG capsule Take 40 mg by mouth daily.    [provider]  potassium chloride (K-DUR) 10 MEQ tablet Take 1 tablet (10 mEq total) by mouth daily. 06/13/17   Lionardo Haze, Bea Graff, PA-C  Pseudoephedrine-APAP-DM (DAYQUIL MULTI-SYMPTOM COLD/FLU PO) Take 15 mLs by mouth every 8 (eight) hours as needed (cold).    [provider]    Family History History reviewed. No pertinent family history.  Social History Social History   Tobacco Use  . Smoking status: Never Smoker  . Smokeless tobacco: Never Used  Substance Use Topics  . Alcohol use: No  . Drug use: No     Allergies   Patient has no known allergies.   Review of Systems Review of Systems  Constitutional: Negative for chills and fever.  HENT: Negative for facial swelling and sore throat.   Respiratory: Positive for shortness of breath.   Cardiovascular: Positive for chest pain.  Gastrointestinal: Negative for abdominal pain, nausea and vomiting.  Genitourinary: Negative for dysuria.  Musculoskeletal: Negative for back pain.  Skin: Negative for rash and wound.  Neurological: Negative for headaches.  Psychiatric/Behavioral: The patient is not nervous/anxious.      Physical Exam Updated Vital Signs BP 138/86   Pulse 86   Temp 98.4 F (36.9 C) (Oral)   Resp 16   Ht 5\' 3"  (1.6 m)   Wt 117.9 kg (260 lb)   SpO2 99%   BMI 46.06 kg/m   Physical Exam  Constitutional: She appears well-developed and well-nourished. No distress.  HENT:  Head: Normocephalic and atraumatic.  Mouth/Throat: Oropharynx is clear and moist. No oropharyngeal exudate.  Eyes: Pupils are equal, round, and reactive to light. Conjunctivae are normal. Right eye  exhibits no discharge. Left eye exhibits no discharge. No scleral icterus.  Neck: Normal range of motion. Neck supple. No thyromegaly present.    Cardiovascular: Normal rate, regular rhythm, normal heart sounds and intact distal pulses. Exam reveals no gallop and no friction rub.  No murmur heard. Pulmonary/Chest: Effort normal and breath sounds normal. No stridor. No respiratory distress. She has no wheezes. She has no rales. She exhibits tenderness.    Abdominal: Soft. Bowel sounds are normal. She exhibits no distension. There is no tenderness. There is no rebound and no guarding.  Musculoskeletal: She exhibits no edema.  Lymphadenopathy:    She has no cervical adenopathy.  Neurological: She is alert. Coordination normal.  Skin: Skin is warm and dry. No rash noted. She is not diaphoretic. No pallor.  Psychiatric: She has a normal mood and affect.  Nursing note and vitals reviewed.    ED Treatments / Results  Labs (all labs ordered are listed, but only abnormal results are displayed) Labs Reviewed  BASIC METABOLIC PANEL - Abnormal; Notable for the following components:      Result Value   Potassium 2.7 (*)    Chloride 99 (*)    Glucose, Bld 113 (*)    All other components within normal limits  MAGNESIUM - Abnormal; Notable for the following components:   Magnesium 1.6 (*)    All other components within normal limits  CBC  TROPONIN I  D-DIMER, QUANTITATIVE (NOT AT [facility])    EKG EKG Interpretation  Date/Time:  Sunday Jun 13 2017 13:16:23 EDT Ventricular Rate:  100 PR Interval:  156 QRS Duration: 86 QT Interval:  330 QTC Calculation: 425 R Axis:   18 Text Interpretation:  Normal sinus rhythm Nonspecific T wave abnormality Abnormal ECG more pronounced flipped t wave in lead III Otherwise no significant change Confirmed by Deno Etienne [phone]) on 06/13/2017 1:56:33 PM Also confirmed by Deno Etienne [phone]), editor Philomena Doheny [phone])  on 06/13/2017 3:55:02 PM   Radiology Dg  Chest 2 View  Result Date: 06/13/2017 CLINICAL DATA:  Sharp pain under right breast. Short of breath today. EXAM: CHEST - 2 VIEW COMPARISON:  03/14/2013 FINDINGS: Normal heart size. Lungs clear. No pneumothorax. No pleural effusion. IMPRESSION: No active cardiopulmonary disease. Electronically Signed   By: Marybelle Killings M.D.   On: 06/13/2017 13:36    Procedures Procedures (including critical care time)  Medications Ordered in ED Medications  ketorolac (TORADOL) 30 MG/ML injection 30 mg (30 mg Intravenous Given 06/13/17 1421)  sodium chloride 0.9 % bolus 500 mL (0 mLs Intravenous Stopped 06/13/17 1527)  potassium chloride 10 mEq in 100 mL IVPB (0 mEq Intravenous Stopped 06/13/17 1631)  potassium chloride SA (K-DUR,KLOR-CON) CR tablet 40 mEq (40 mEq Oral Given 06/13/17 1528)     Initial Impression / Assessment and Plan / ED Course  I have reviewed the triage vital signs and the nursing notes.  Pertinent labs & imaging results that were available during my care of the patient were reviewed by me and considered in my medical decision making (see chart for details).  Clinical Course as of Jun 13 1837  Sun Jun 13, 2017  1555 On reevaluation, patient is feeling better after Toradol and initiation of potassium infusion.   [AL]    Clinical Course User Index [AL] Frederica Kuster, PA-C    Patient with atypical chest pain.  Her pain is reproducible on palpation and pleuritic.  It is improved with Toradol.  CBC negative.  Troponin and d-dimer negative.  Low suspicion for ACS or PE.  BMP shows hypokalemia, potassium 2.7.  No signs of emergent EKG changes.  Patient given 2 rounds of potassium IV and 40 mEq of oral potassium.  Will discharge home with oral potassium as well.  Patient advised to talk to her doctor about her diuretic and discuss either discontinuation or continuation of potassium supplementation.  NSAID treatment discussed for chest wall pain, although this may be related to hypokalemia.  I  also discussed ice and heat.  Return precautions discussed.  Patient understands and agrees with plan.  Patient vitals stable throughout ED course and discharged in satisfactory condition. I discussed patient case with Dr. Tyrone Nine who guided the patient's management and agrees with plan.   Final Clinical Impressions(s) / ED Diagnoses   Final diagnoses:  Chest wall pain  Hypokalemia    ED Discharge Orders        Ordered    potassium chloride (K-DUR) 10 MEQ tablet  Daily     06/13/17 1813    naproxen (NAPROSYN) 500 MG tablet  2 times daily     06/13/17 1813       Carney Saxton, Bea Graff, PA-C 06/13/17 [place], [place], DO 06/14/17 [phone]

## 2017-06-13 NOTE — ED Notes (Signed)
ED Provider at bedside. 

## 2018-05-30 ENCOUNTER — Other Ambulatory Visit: Payer: Self-pay | Admitting: Surgery

## 2018-05-30 DIAGNOSIS — E049 Nontoxic goiter, unspecified: Secondary | ICD-10-CM

## 2018-06-02 ENCOUNTER — Ambulatory Visit
Admission: RE | Admit: 2018-06-02 | Discharge: 2018-06-02 | Disposition: A | Discharge status: Home / Self Care | Payer: 59 | Source: Ambulatory Visit | Attending: Surgery | Admitting: Surgery

## 2018-06-02 DIAGNOSIS — E049 Nontoxic goiter, unspecified: Secondary | ICD-10-CM

## 2018-06-15 ENCOUNTER — Other Ambulatory Visit: Payer: Self-pay | Admitting: Surgery

## 2018-06-15 DIAGNOSIS — E041 Nontoxic single thyroid nodule: Secondary | ICD-10-CM

## 2018-06-22 ENCOUNTER — Other Ambulatory Visit (HOSPITAL_COMMUNITY)
Admission: RE | Admit: 2018-06-22 | Discharge: 2018-06-22 | Disposition: A | Discharge status: Home / Self Care | Payer: Medicaid Other | Source: Ambulatory Visit | Attending: Radiology | Admitting: Radiology

## 2018-06-22 ENCOUNTER — Ambulatory Visit
Admission: RE | Admit: 2018-06-22 | Discharge: 2018-06-22 | Disposition: A | Discharge status: Home / Self Care | Payer: Medicaid Other | Source: Ambulatory Visit | Attending: Surgery | Admitting: Surgery

## 2018-06-22 DIAGNOSIS — E041 Nontoxic single thyroid nodule: Secondary | ICD-10-CM | POA: Diagnosis present

## 2018-06-28 ENCOUNTER — Encounter (HOSPITAL_COMMUNITY): Payer: Self-pay

## 2018-07-15 ENCOUNTER — Ambulatory Visit: Payer: Self-pay | Admitting: Surgery

## 2018-08-26 ENCOUNTER — Other Ambulatory Visit: Payer: Self-pay | Admitting: Family Medicine

## 2018-08-26 ENCOUNTER — Encounter: Payer: Self-pay | Admitting: Surgery

## 2018-08-26 DIAGNOSIS — D440 Neoplasm of uncertain behavior of thyroid gland: Secondary | ICD-10-CM | POA: Diagnosis present

## 2018-08-26 DIAGNOSIS — Z1231 Encounter for screening mammogram for malignant neoplasm of breast: Secondary | ICD-10-CM

## 2018-08-26 NOTE — H&P (Signed)
General Surgery [facility]  Kelli Carroll DOB: [birth year] Single / Language: English / Race: Black or African American Female   History of Present Illness  The patient is a 43 year old female who presents with a complaint of Enlarged thyroid.  CHIEF COMPLAINT: right thyroid mass  Patient is referred by Dr. Ronnald Ramp at [place] for surgical evaluation and management of a right neck mass presumed to be arising from the right side of the thyroid gland. Patient was originally evaluated 1 year ago. She had laboratory studies which were normal by report. She underwent an ultrasound in August 2019 at [facility] which demonstrated the right lobe measuring 4.7 x 3.0 x 3.3 cm versus a normal left lobe measuring 4.2 x 1.1 x 1.1 cm. Right lobe was felt to be diffusely heterogeneous. Patient states that over the past year it has gradually increased in size. She has noted dysphagia as well as a dry cough. She notes occasional voice changes with raspiness. She has not had any recent diagnostic studies. Patient is not on thyroid medication. She has had no prior head or neck surgery. Family history is notable for Graves' disease and the patient's sister. There is no family history of other endocrine neoplasms. Patient presents today for evaluation and recommendations.   Diagnostic Studies History ( Colonoscopy  1-5 years ago Mammogram  never Pap Smear  >5 years ago  Allergies  No Known Drug Allergies  Allergies Reconciled   Medication History  amLODIPine Besylate (10MG  Tablet, Oral) Active. Chlorthalidone (25MG  Tablet, Oral) Active. Omeprazole (40MG  Capsule DR, Oral) Active. hydroCHLOROthiazide (12.5MG  Tablet, Oral) Active. Vitamin D (Cholecalciferol) (10 MCG(400 UNIT) Capsule, Oral) Active. Medications Reconciled  Social History  Alcohol use  Occasional alcohol use. Caffeine use  Carbonated beverages, Tea. No drug use  Tobacco  use  Never smoker.  Family History Hypertension  Brother, Father, Mother, Sister. Thyroid problems  Sister.  Pregnancy / Birth History Age at menarche  43 years. Age of menopause  <44 Gravida  4 Irregular periods  Maternal age  23-20 Para  2  Other Problems Gastroesophageal Reflux Disease  Heart murmur  High blood pressure  Sleep Apnea   Review of Systems General Not Present- Appetite Loss, Chills, Fatigue, Fever, Night Sweats, Weight Gain and Weight Loss. Skin Not Present- Change in Wart/Mole, Dryness, Hives, Jaundice, New Lesions, Non-Healing Wounds, Rash and Ulcer. HEENT Present- Earache, Ringing in the Ears and Seasonal Allergies. Not Present- Hearing Loss, Hoarseness, Nose Bleed, Oral Ulcers, Sinus Pain, Sore Throat, Visual Disturbances, Wears glasses/contact lenses and Yellow Eyes. Respiratory Present- Snoring. Not Present- Bloody sputum, Chronic Cough, Difficulty Breathing and Wheezing. Cardiovascular Present- Leg Cramps and Swelling of Extremities. Not Present- Chest Pain, Difficulty Breathing Lying Down, Palpitations, Rapid Heart Rate and Shortness of Breath. Gastrointestinal Present- Change in Bowel Habits and Difficulty Swallowing. Not Present- Abdominal Pain, Bloating, Bloody Stool, Chronic diarrhea, Constipation, Excessive gas, Gets full quickly at meals, Hemorrhoids, Indigestion, Nausea, Rectal Pain and Vomiting. Female Genitourinary Not Present- Frequency, Nocturia, Painful Urination, Pelvic Pain and Urgency. Musculoskeletal Not Present- Back Pain, Joint Pain, Joint Stiffness, Muscle Pain, Muscle Weakness and Swelling of Extremities. Neurological Present- Headaches, Numbness and Tingling. Not Present- Decreased Memory, Fainting, Seizures, Tremor, Trouble walking and Weakness. Psychiatric Not Present- Anxiety, Bipolar, Change in Sleep Pattern, Depression, Fearful and Frequent crying. Endocrine Present- Hot flashes. Not Present- Cold Intolerance, Excessive  Hunger, Hair Changes, Heat Intolerance and New Diabetes. Hematology Not Present- Blood Thinners, Easy Bruising, Excessive bleeding,  Gland problems, HIV and Persistent Infections.  Vitals Weight: 276 lb Height: 63in Body Surface Area: 2.22 m Body Mass Index: 48.89 kg/m  Temp.: 98.18F (Oral)  Pulse: 91 (Regular)  BP: 144/92(Sitting, Left Arm, Standard)  Physical Exam  See vital signs recorded above  GENERAL APPEARANCE Development: normal Nutritional status: normal Gross deformities: none  SKIN Rash, lesions, ulcers: none Induration, erythema: none Nodules: none palpable  EYES Conjunctiva and lids: normal Pupils: equal and reactive Iris: normal bilaterally  EARS, NOSE, MOUTH, THROAT External ears: no lesion or deformity External nose: no lesion or deformity Hearing: grossly normal Lips: no lesion or deformity Dentition: normal for age Oral mucosa: moist  NECK Symmetric: no Trachea: midline Thyroid: There is a visible mass in the right thyroid bed. On palpation this is quite firm, discrete, mobile, and nontender. Mass measures at least 4 cm in greatest dimension. Palpation of the left thyroid lobe shows no nodules and no tenderness. There is no palpable lymphadenopathy. Voice quality is normal.  CHEST Respiratory effort: normal Retraction or accessory muscle use: no Breath sounds: normal bilaterally Rales, rhonchi, wheeze: none  CARDIOVASCULAR Auscultation: regular rhythm, normal rate Murmurs: none Pulses: carotid and radial pulse 2+ palpable Lower extremity edema: none Lower extremity varicosities: none  MUSCULOSKELETAL Station and gait: normal Digits and nails: no clubbing or cyanosis Muscle strength: grossly normal all extremities Range of motion: grossly normal all extremities Deformity: none  LYMPHATIC Cervical: none palpable Supraclavicular: none palpable  PSYCHIATRIC Oriented to person, place, and time: yes Mood and affect: normal  for situation Judgment and insight: appropriate for situation    Assessment & Plan   ENLARGED THYROID (E04.9)  Follow Up - Call [facility] office after tests / studies doneto discuss further plans  Patient presents today on referral from her primary care physician for evaluation of an enlarging right neck mass. This appears to be arising from the right lobe of the thyroid. Patient did have some studies performed 1 year ago. She has not had any recent studies.  Patient has a right thyroid lobe enlargement on physical examination. Left lobe is normal on physical examination. I would like to obtain an ultrasound through [facility] imaging to better evaluate the thyroid gland. We will also obtain a TSH level today from the laboratory. When these studies are available for review, I will contact the patient. She may require fine-needle aspiration biopsy which we discussed today in the office. Patient will likely come to thyroid surgery at some point.  Patient was under go ultrasound exam and laboratory testing. We will be in touch with her regarding the results.  ADDENDUM Patient underwent USN and FNA biopsy.  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  Plan right thyroid lobectomy for definitive diagnosis and management.  The risks and benefits of the procedure have been discussed at length with the patient.  The patient understands the proposed procedure, potential alternative treatments, and the course of recovery to be expected.  All of the patient's questions have been answered at this time.  The patient wishes to proceed with surgery.  Armandina Gemma, [place] [facility] Office: [phone]

## 2018-08-26 NOTE — Patient Instructions (Addendum)
YOU NEED TO HAVE A COVID 19 TEST ON___8-10-2020_______, THIS TEST MUST BE DONE BEFORE SURGERY, COME  Kelli Carroll, [place]  , [id number]. ONCE YOUR COVID TEST IS COMPLETED, PLEASE BEGIN THE QUARANTINE INSTRUCTIONS AS OUTLINED IN YOUR HANDOUT.                Kelli Carroll     Your procedure is scheduled on: 09-01-2018    Report to [place]  Entrance    Report to admitting at [place] DAY OF YOUR SURGERY.    Call this number if you have problems the morning of surgery [phone]    Remember: Do not eat food or drink liquids :After Midnight. BRUSH YOUR TEETH MORNING OF SURGERY AND RINSE YOUR MOUTH OUT, NO CHEWING GUM CANDY OR MINTS.     Take these medicines the morning of surgery with A SIP OF WATER: AMLODIPINE, VENLAFAXINE, NEXIUM                                 You may not have any metal on your body including hair pins and              piercings  Do not wear jewelry, make-up, lotions, powders or perfumes, deodorant             Do not wear nail polish.  Do not shave  48 hours prior to surgery.              Men may shave face and neck.   Do not bring valuables to the hospital. [place].  Contacts, dentures or bridgework may not be worn into surgery.                Please read over the following fact sheets you were given: _____________________________________________________________________             [facility] - Preparing for Surgery Before surgery, you can play an important role.  Because skin is not sterile, your skin needs to be as free of germs as possible.  You can reduce the number of germs on your skin by washing with CHG (chlorahexidine gluconate) soap before surgery.  CHG is an antiseptic cleaner which kills germs and bonds with the skin to continue killing germs even after washing. Please DO NOT use if you have an allergy to CHG or  antibacterial soaps.  If your skin becomes reddened/irritated stop using the CHG and inform your nurse when you arrive at Short Stay. Do not shave (including legs and underarms) for at least 48 hours prior to the first CHG shower.  You may shave your face/neck. Please follow these instructions carefully:  1.  Shower with CHG Soap the night before surgery and the  morning of Surgery.  2.  If you choose to wash your hair, wash your hair first as usual with your  normal  shampoo.  3.  After you shampoo, rinse your hair and body thoroughly to remove the  shampoo.                           4.  Use CHG as you would any other liquid soap.  You can apply chg directly  to the skin and wash                       Gently with a scrungie or clean washcloth.  5.  Apply the CHG Soap to your body ONLY FROM THE NECK DOWN.   Do not use on face/ open                           Wound or open sores. Avoid contact with eyes, ears mouth and genitals (private parts).                       Wash face,  Genitals (private parts) with your normal soap.             6.  Wash thoroughly, paying special attention to the area where your surgery  will be performed.  7.  Thoroughly rinse your body with warm water from the neck down.  8.  DO NOT shower/wash with your normal soap after using and rinsing off  the CHG Soap.                9.  Pat yourself dry with a clean towel.            10.  Wear clean pajamas.            11.  Place clean sheets on your bed the night of your first shower and do not  sleep with pets. Day of Surgery : Do not apply any lotions/deodorants the morning of surgery.  Please wear clean clothes to the hospital/surgery center.  FAILURE TO FOLLOW THESE INSTRUCTIONS MAY RESULT IN THE CANCELLATION OF YOUR SURGERY PATIENT SIGNATURE_________________________________  NURSE SIGNATURE__________________________________  ________________________________________________________________________

## 2018-08-29 ENCOUNTER — Encounter (INDEPENDENT_AMBULATORY_CARE_PROVIDER_SITE_OTHER): Payer: Self-pay

## 2018-08-29 ENCOUNTER — Encounter (HOSPITAL_COMMUNITY): Payer: Self-pay

## 2018-08-29 ENCOUNTER — Encounter (HOSPITAL_COMMUNITY)
Admission: RE | Admit: 2018-08-29 | Discharge: 2018-08-29 | Disposition: A | Discharge status: Home / Self Care | Payer: Medicaid Other | Source: Ambulatory Visit | Attending: Surgery | Admitting: Surgery

## 2018-08-29 ENCOUNTER — Other Ambulatory Visit: Payer: Self-pay

## 2018-08-29 ENCOUNTER — Other Ambulatory Visit (HOSPITAL_COMMUNITY)
Admission: RE | Admit: 2018-08-29 | Discharge: 2018-08-29 | Disposition: A | Discharge status: Home / Self Care | Payer: Medicaid Other | Source: Ambulatory Visit | Attending: Surgery | Admitting: Surgery

## 2018-08-29 DIAGNOSIS — Z01818 Encounter for other preprocedural examination: Secondary | ICD-10-CM | POA: Insufficient documentation

## 2018-08-29 DIAGNOSIS — Z20828 Contact with and (suspected) exposure to other viral communicable diseases: Secondary | ICD-10-CM | POA: Diagnosis not present

## 2018-08-29 DIAGNOSIS — C73 Malignant neoplasm of thyroid gland: Secondary | ICD-10-CM | POA: Insufficient documentation

## 2018-08-29 DIAGNOSIS — Z01811 Encounter for preprocedural respiratory examination: Secondary | ICD-10-CM

## 2018-08-29 DIAGNOSIS — I10 Essential (primary) hypertension: Secondary | ICD-10-CM | POA: Diagnosis not present

## 2018-08-29 HISTORY — DX: Anxiety disorder, unspecified: F41.9

## 2018-08-29 HISTORY — DX: Depression, unspecified: F32.A

## 2018-08-29 HISTORY — DX: Prediabetes: R73.03

## 2018-08-29 HISTORY — DX: Calculus of kidney: N20.0

## 2018-08-29 HISTORY — DX: Sleep apnea, unspecified: G47.30

## 2018-08-29 HISTORY — DX: Neoplasm of uncertain behavior of thyroid gland: D44.0

## 2018-08-29 HISTORY — DX: Cardiac murmur, unspecified: R01.1

## 2018-08-29 LAB — SARS CORONAVIRUS 2 (TAT 6-24 HRS): SARS Coronavirus 2: NEGATIVE

## 2018-08-29 NOTE — Progress Notes (Signed)
PATIENT STATES SHE HAD EKG, CXR, AND COMPLETE PANEL DONE AT HER PCP, DR JACOBS , OFFICE ON 08-25-2018 . RN CONTACTED [facility] OFFICES HOWEVER PER RECEPTIONIST , NURSES WERE AT [place] . RN CALLED BACK AT 1600 AND SPOKE WITH NURSE AND REQUESTED THE ABOVE MENTIONED RECORDS .

## 2018-08-30 NOTE — Progress Notes (Signed)
The following were received today from [facility], Dr Kristie Cowman office:  CXR 08-25-2018 on chart   EKG 07-04-2018 on chart   Cbcdiff, CMP, lipid panel, thyroid, urinalysis 08-25-2018  on chart

## 2018-08-31 ENCOUNTER — Telehealth (HOSPITAL_COMMUNITY): Payer: Self-pay | Admitting: *Deleted

## 2018-08-31 MED ORDER — DEXTROSE 5 % IV SOLN
3.0000 g | Freq: Once | INTRAVENOUS | Status: AC
  Administered 2018-09-01: 3 g via INTRAVENOUS
  Filled 2018-08-31: qty 3

## 2018-09-01 ENCOUNTER — Observation Stay (HOSPITAL_COMMUNITY)
Admission: RE | Admit: 2018-09-01 | Discharge: 2018-09-02 | Disposition: A | Discharge status: Home / Self Care | Payer: Medicaid Other | Source: Ambulatory Visit | Attending: Surgery | Admitting: Surgery

## 2018-09-01 ENCOUNTER — Other Ambulatory Visit: Payer: Self-pay

## 2018-09-01 ENCOUNTER — Ambulatory Visit (HOSPITAL_COMMUNITY): Payer: Medicaid Other | Admitting: Physician Assistant

## 2018-09-01 ENCOUNTER — Ambulatory Visit (HOSPITAL_COMMUNITY): Payer: Medicaid Other | Admitting: Anesthesiology

## 2018-09-01 ENCOUNTER — Encounter (HOSPITAL_COMMUNITY): Payer: Self-pay | Admitting: *Deleted

## 2018-09-01 ENCOUNTER — Encounter (HOSPITAL_COMMUNITY)
Admission: RE | Disposition: A | Discharge status: Home / Self Care | Payer: Self-pay | Source: Ambulatory Visit | Attending: Surgery

## 2018-09-01 DIAGNOSIS — I10 Essential (primary) hypertension: Secondary | ICD-10-CM | POA: Insufficient documentation

## 2018-09-01 DIAGNOSIS — Z79899 Other long term (current) drug therapy: Secondary | ICD-10-CM | POA: Insufficient documentation

## 2018-09-01 DIAGNOSIS — E041 Nontoxic single thyroid nodule: Secondary | ICD-10-CM | POA: Diagnosis present

## 2018-09-01 DIAGNOSIS — K219 Gastro-esophageal reflux disease without esophagitis: Secondary | ICD-10-CM | POA: Diagnosis not present

## 2018-09-01 DIAGNOSIS — Z6841 Body Mass Index (BMI) 40.0 and over, adult: Secondary | ICD-10-CM | POA: Insufficient documentation

## 2018-09-01 DIAGNOSIS — D34 Benign neoplasm of thyroid gland: Principal | ICD-10-CM | POA: Insufficient documentation

## 2018-09-01 DIAGNOSIS — D440 Neoplasm of uncertain behavior of thyroid gland: Secondary | ICD-10-CM

## 2018-09-01 DIAGNOSIS — G4730 Sleep apnea, unspecified: Secondary | ICD-10-CM | POA: Insufficient documentation

## 2018-09-01 HISTORY — PX: THYROID LOBECTOMY: SHX420

## 2018-09-01 LAB — PREGNANCY, URINE: Preg Test, Ur: NEGATIVE

## 2018-09-01 SURGERY — LOBECTOMY, THYROID
Anesthesia: General | Site: Neck | Laterality: Right

## 2018-09-01 MED ORDER — OXYCODONE HCL 5 MG PO TABS: 5.0000 mg | ORAL_TABLET | Freq: Once | ORAL | Status: DC | PRN

## 2018-09-01 MED ORDER — POTASSIUM CHLORIDE CRYS ER 20 MEQ PO TBCR
20.0000 meq | EXTENDED_RELEASE_TABLET | Freq: Two times a day (BID) | ORAL | Status: DC
  Administered 2018-09-01 – 2018-09-02 (×2): 20 meq via ORAL
  Filled 2018-09-01 (×2): qty 1

## 2018-09-01 MED ORDER — SUGAMMADEX SODIUM 200 MG/2ML IV SOLN
INTRAVENOUS | Status: DC | PRN
  Administered 2018-09-01: 300 mg via INTRAVENOUS

## 2018-09-01 MED ORDER — ONDANSETRON HCL 4 MG/2ML IJ SOLN: 4.0000 mg | Freq: Four times a day (QID) | INTRAMUSCULAR | Status: DC | PRN

## 2018-09-01 MED ORDER — ROCURONIUM BROMIDE 10 MG/ML (PF) SYRINGE
PREFILLED_SYRINGE | INTRAVENOUS | Status: AC
  Filled 2018-09-01: qty 10

## 2018-09-01 MED ORDER — CHLORHEXIDINE GLUCONATE CLOTH 2 % EX PADS: 6.0000 | MEDICATED_PAD | Freq: Once | CUTANEOUS | Status: DC

## 2018-09-01 MED ORDER — ONDANSETRON 4 MG PO TBDP: 4.0000 mg | ORAL_TABLET | Freq: Four times a day (QID) | ORAL | Status: DC | PRN

## 2018-09-01 MED ORDER — KCL IN DEXTROSE-NACL 20-5-0.45 MEQ/L-%-% IV SOLN
INTRAVENOUS | Status: DC
  Administered 2018-09-01: 14:00:00 via INTRAVENOUS
  Filled 2018-09-01: qty 1000

## 2018-09-01 MED ORDER — DEXAMETHASONE SODIUM PHOSPHATE 10 MG/ML IJ SOLN
INTRAMUSCULAR | Status: AC
  Filled 2018-09-01: qty 1

## 2018-09-01 MED ORDER — FENTANYL CITRATE (PF) 100 MCG/2ML IJ SOLN
25.0000 ug | INTRAMUSCULAR | Status: DC | PRN
  Administered 2018-09-01 (×3): 50 ug via INTRAVENOUS

## 2018-09-01 MED ORDER — LIP MEDEX EX OINT
TOPICAL_OINTMENT | CUTANEOUS | Status: AC
  Filled 2018-09-01: qty 7

## 2018-09-01 MED ORDER — LIDOCAINE 2% (20 MG/ML) 5 ML SYRINGE
INTRAMUSCULAR | Status: DC | PRN
  Administered 2018-09-01: 100 mg via INTRAVENOUS

## 2018-09-01 MED ORDER — ONDANSETRON HCL 4 MG/2ML IJ SOLN
INTRAMUSCULAR | Status: DC | PRN
  Administered 2018-09-01: 4 mg via INTRAVENOUS

## 2018-09-01 MED ORDER — PROPOFOL 10 MG/ML IV BOLUS
INTRAVENOUS | Status: DC | PRN
  Administered 2018-09-01: 200 mg via INTRAVENOUS

## 2018-09-01 MED ORDER — LIDOCAINE 2% (20 MG/ML) 5 ML SYRINGE
INTRAMUSCULAR | Status: AC
  Filled 2018-09-01: qty 5

## 2018-09-01 MED ORDER — ROCURONIUM BROMIDE 10 MG/ML (PF) SYRINGE
PREFILLED_SYRINGE | INTRAVENOUS | Status: DC | PRN
  Administered 2018-09-01: 50 mg via INTRAVENOUS
  Administered 2018-09-01: 10 mg via INTRAVENOUS

## 2018-09-01 MED ORDER — LACTATED RINGERS IV SOLN
INTRAVENOUS | Status: DC
  Administered 2018-09-01: 10:00:00 via INTRAVENOUS

## 2018-09-01 MED ORDER — ONDANSETRON HCL 4 MG/2ML IJ SOLN
INTRAMUSCULAR | Status: AC
  Filled 2018-09-01: qty 2

## 2018-09-01 MED ORDER — TRAMADOL HCL 50 MG PO TABS: 50.0000 mg | ORAL_TABLET | Freq: Four times a day (QID) | ORAL | Status: DC | PRN

## 2018-09-01 MED ORDER — SUCCINYLCHOLINE CHLORIDE 200 MG/10ML IV SOSY
PREFILLED_SYRINGE | INTRAVENOUS | Status: DC | PRN
  Administered 2018-09-01: 120 mg via INTRAVENOUS

## 2018-09-01 MED ORDER — HYDROCODONE-ACETAMINOPHEN 5-325 MG PO TABS
1.0000 | ORAL_TABLET | ORAL | Status: DC | PRN
  Administered 2018-09-01 (×2): 1 via ORAL
  Filled 2018-09-01 (×2): qty 1

## 2018-09-01 MED ORDER — SUGAMMADEX SODIUM 500 MG/5ML IV SOLN
INTRAVENOUS | Status: AC
  Filled 2018-09-01: qty 5

## 2018-09-01 MED ORDER — FENTANYL CITRATE (PF) 100 MCG/2ML IJ SOLN
INTRAMUSCULAR | Status: AC
  Filled 2018-09-01: qty 2

## 2018-09-01 MED ORDER — PROMETHAZINE HCL 25 MG/ML IJ SOLN: 6.2500 mg | INTRAMUSCULAR | Status: DC | PRN

## 2018-09-01 MED ORDER — HYDROMORPHONE HCL 1 MG/ML IJ SOLN
1.0000 mg | INTRAMUSCULAR | Status: DC | PRN
  Administered 2018-09-01: 1 mg via INTRAVENOUS
  Filled 2018-09-01: qty 1

## 2018-09-01 MED ORDER — DEXAMETHASONE SODIUM PHOSPHATE 10 MG/ML IJ SOLN
INTRAMUSCULAR | Status: DC | PRN
  Administered 2018-09-01: 8 mg via INTRAVENOUS

## 2018-09-01 MED ORDER — ACETAMINOPHEN 650 MG RE SUPP: 650.0000 mg | Freq: Four times a day (QID) | RECTAL | Status: DC | PRN

## 2018-09-01 MED ORDER — OXYCODONE HCL 5 MG/5ML PO SOLN: 5.0000 mg | Freq: Once | ORAL | Status: DC | PRN

## 2018-09-01 MED ORDER — FENTANYL CITRATE (PF) 250 MCG/5ML IJ SOLN
INTRAMUSCULAR | Status: AC
  Filled 2018-09-01: qty 5

## 2018-09-01 MED ORDER — FENTANYL CITRATE (PF) 250 MCG/5ML IJ SOLN
INTRAMUSCULAR | Status: DC | PRN
  Administered 2018-09-01: 50 ug via INTRAVENOUS
  Administered 2018-09-01: 100 ug via INTRAVENOUS

## 2018-09-01 MED ORDER — MIDAZOLAM HCL 2 MG/2ML IJ SOLN
INTRAMUSCULAR | Status: AC
  Filled 2018-09-01: qty 2

## 2018-09-01 MED ORDER — CHLORTHALIDONE 25 MG PO TABS
25.0000 mg | ORAL_TABLET | Freq: Every day | ORAL | Status: DC
  Administered 2018-09-02: 25 mg via ORAL
  Filled 2018-09-01: qty 1

## 2018-09-01 MED ORDER — MIDAZOLAM HCL 2 MG/2ML IJ SOLN
INTRAMUSCULAR | Status: DC | PRN
  Administered 2018-09-01: 2 mg via INTRAVENOUS

## 2018-09-01 MED ORDER — 0.9 % SODIUM CHLORIDE (POUR BTL) OPTIME
TOPICAL | Status: DC | PRN
  Administered 2018-09-01: 1000 mL

## 2018-09-01 MED ORDER — AMLODIPINE BESYLATE 10 MG PO TABS
10.0000 mg | ORAL_TABLET | Freq: Every day | ORAL | Status: DC
  Administered 2018-09-02: 10 mg via ORAL
  Filled 2018-09-01: qty 1

## 2018-09-01 MED ORDER — ACETAMINOPHEN 325 MG PO TABS: 650.0000 mg | ORAL_TABLET | Freq: Four times a day (QID) | ORAL | Status: DC | PRN

## 2018-09-01 SURGICAL SUPPLY — 31 items
ATTRACTOMAT 16X20 MAGNETIC DRP (DRAPES) ×2 IMPLANT
BLADE SURG 15 STRL LF DISP TIS (BLADE) ×1 IMPLANT
BLADE SURG 15 STRL SS (BLADE) ×1
CHLORAPREP W/TINT 26 (MISCELLANEOUS) ×2 IMPLANT
CLIP VESOCCLUDE MED 6/CT (CLIP) ×4 IMPLANT
CLIP VESOCCLUDE SM WIDE 6/CT (CLIP) ×6 IMPLANT
COVER SURGICAL LIGHT HANDLE (MISCELLANEOUS) ×2 IMPLANT
COVER WAND RF STERILE (DRAPES) ×2 IMPLANT
DERMABOND ADVANCED (GAUZE/BANDAGES/DRESSINGS) ×1
DERMABOND ADVANCED .7 DNX12 (GAUZE/BANDAGES/DRESSINGS) ×1 IMPLANT
DRAPE LAPAROTOMY T 98X78 PEDS (DRAPES) ×2 IMPLANT
ELECT PENCIL ROCKER SW 15FT (MISCELLANEOUS) ×2 IMPLANT
ELECT REM PT RETURN 15FT ADLT (MISCELLANEOUS) ×2 IMPLANT
GAUZE 4X4 16PLY RFD (DISPOSABLE) ×2 IMPLANT
GLOVE BIOGEL PI IND STRL 7.0 (GLOVE) ×3 IMPLANT
GLOVE BIOGEL PI INDICATOR 7.0 (GLOVE) ×3
GLOVE SURG ORTHO 8.0 STRL STRW (GLOVE) ×2 IMPLANT
GLOVE SURG SS PI 7.0 STRL IVOR (GLOVE) ×4 IMPLANT
GOWN STRL REUS W/TWL XL LVL3 (GOWN DISPOSABLE) ×6 IMPLANT
HEMOSTAT SURGICEL 2X4 FIBR (HEMOSTASIS) ×2 IMPLANT
ILLUMINATOR WAVEGUIDE N/F (MISCELLANEOUS) ×2 IMPLANT
KIT BASIN OR (CUSTOM PROCEDURE TRAY) ×2 IMPLANT
KIT TURNOVER KIT A (KITS) IMPLANT
PACK BASIC VI WITH GOWN DISP (CUSTOM PROCEDURE TRAY) ×2 IMPLANT
SHEARS HARMONIC 9CM CVD (BLADE) ×2 IMPLANT
SUT MNCRL AB 4-0 PS2 18 (SUTURE) ×2 IMPLANT
SUT VIC AB 3-0 SH 18 (SUTURE) ×4 IMPLANT
SYR BULB IRRIGATION 50ML (SYRINGE) ×2 IMPLANT
TOWEL OR 17X26 10 PK STRL BLUE (TOWEL DISPOSABLE) ×2 IMPLANT
TOWEL OR NON WOVEN STRL DISP B (DISPOSABLE) ×2 IMPLANT
TUBING CONNECTING 10 (TUBING) ×2 IMPLANT

## 2018-09-01 NOTE — Transfer of Care (Signed)
Immediate Anesthesia Transfer of Care Note  Patient: Kelli Carroll  Procedure(s) Performed: RIGHT THYROID LOBECTOMY (Right Neck)  Patient Location: PACU  Anesthesia Type:General  Level of Consciousness: drowsy  Airway & Oxygen Therapy: Patient Spontanous Breathing and Patient connected to face mask oxygen  Post-op Assessment: Report given to RN and Post -op Vital signs reviewed and stable  Post vital signs: Reviewed and stable  Last Vitals:  Vitals Value Taken Time  BP 153/84 09/01/18 1222  Temp    Pulse 108 09/01/18 1222  Resp 17 09/01/18 1222  SpO2 98 % 09/01/18 1222  Vitals shown include unvalidated device data.  Last Pain:  Vitals:   09/01/18 0949  TempSrc: Oral         Complications: No apparent anesthesia complications

## 2018-09-01 NOTE — Anesthesia Preprocedure Evaluation (Addendum)
Anesthesia Evaluation  Patient identified by MRN, date of birth, ID band Patient awake    Reviewed: Allergy & Precautions, NPO status , Patient's Chart, lab work & pertinent test results  History of Anesthesia Complications Negative for: history of anesthetic complications  Airway Mallampati: II  TM Distance: >3 FB Neck ROM: Full    Dental  (+) Dental Advisory Given, Teeth Intact   Pulmonary sleep apnea ,    breath sounds clear to auscultation       Cardiovascular hypertension, Pt. on medications (-) angina Rhythm:Regular Rate:Normal     Neuro/Psych  Headaches, PSYCHIATRIC DISORDERS Anxiety Depression    GI/Hepatic Neg liver ROS, GERD  Medicated and Controlled,  Endo/Other  Morbid obesity Pre-DM   Renal/GU  Kidney stones      Musculoskeletal negative musculoskeletal ROS (+)   Abdominal (+) + obese,   Peds  Hematology negative hematology ROS (+)   Anesthesia Other Findings   Reproductive/Obstetrics  S/p tubal ligation                             Anesthesia Physical Anesthesia Plan  ASA: III  Anesthesia Plan: General   Post-op Pain Management:    Induction: Intravenous  PONV Risk Score and Plan: 4 or greater and Treatment may vary due to age or medical condition, Ondansetron, Midazolam and Dexamethasone  Airway Management Planned: Oral ETT  Additional Equipment: None  Intra-op Plan:   Post-operative Plan: Extubation in OR  Informed Consent: I have reviewed the patients History and Physical, chart, labs and discussed the procedure including the risks, benefits and alternatives for the proposed anesthesia with the patient or authorized representative who has indicated his/her understanding and acceptance.     Dental advisory given  Plan Discussed with: CRNA and Anesthesiologist  Anesthesia Plan Comments:        Anesthesia Quick Evaluation

## 2018-09-01 NOTE — Anesthesia Procedure Notes (Signed)
Procedure Name: Intubation Date/Time: 09/01/2018 10:56 AM Performed by: Victoriano Lain, CRNA Pre-anesthesia Checklist: Patient identified, Emergency Drugs available, Suction available, Patient being monitored and Timeout performed Patient Re-evaluated:Patient Re-evaluated prior to induction Oxygen Delivery Method: Circle system utilized Preoxygenation: Pre-oxygenation with 100% oxygen Induction Type: IV induction Laryngoscope Size: Mac and 4 Grade View: Grade I Tube type: Reinforced Tube size: 7.5 mm Number of attempts: 1 Airway Equipment and Method: Stylet Placement Confirmation: ETT inserted through vocal cords under direct vision,  positive ETCO2 and breath sounds checked- equal and bilateral Secured at: 23 cm Tube secured with: Tape Dental Injury: Teeth and Oropharynx as per pre-operative assessment

## 2018-09-01 NOTE — Interval H&P Note (Signed)
History and Physical Interval Note:  09/01/2018 10:35 AM  Kelli Carroll  has presented today for surgery, with the diagnosis of THYROID NEOPLASM OF UNCERTAIN BEHAVIOR.  The various methods of treatment have been discussed with the patient and family. After consideration of risks, benefits and other options for treatment, the patient has consented to    Procedure(s): RIGHT THYROID LOBECTOMY (Right) as a surgical intervention.    The patient's history has been reviewed, patient examined, no change in status, stable for surgery.  I have reviewed the patient's chart and labs.  Questions were answered to the patient's satisfaction.    Armandina Gemma, [place] [facility] Office: [place]

## 2018-09-01 NOTE — Op Note (Signed)
Procedure Note  Pre-operative Diagnosis:  Right thyroid nodule, thyroid neoplasm of uncertain behavior  Post-operative Diagnosis:  same  Surgeon:  Armandina Gemma, MD  Assistant:  none   Procedure:  Right thyroid lobectomy  Anesthesia:  General  Estimated Blood Loss:  minimal  Drains: none         Specimen: thyroid lobe to pathology  Indications:  Patient is referred by Dr. Ronnald Ramp at [facility] medical for surgical evaluation and management of a right neck mass presumed to be arising from the right side of the thyroid gland. Patient was originally evaluated 1 year ago. She had laboratory studies which were normal by report. She underwent an ultrasound in August 2019 at [facility] which demonstrated the right lobe measuring 4.7 x 3.0 x 3.3 cm versus a normal left lobe measuring 4.2 x 1.1 x 1.1 cm. Right lobe was felt to be diffusely heterogeneous. Patient states that over the past year it has gradually increased in size. She has noted dysphagia as well as a dry cough. She notes occasional voice changes with raspiness. USN demonstrated a dominant nodule occupying almost the entire lobe.  FNA biopsy demonstrated atypia, Bethesda category III.  Patient now comes for right thyroid lobectomy for definitive diagnosis and management.  Procedure Details: Procedure was done in OR #6 at the [facility]. The patient was brought to the operating room and placed in a supine position on the operating room table. Following administration of general anesthesia, the patient was positioned and then prepped and draped in the usual aseptic fashion. After ascertaining that an adequate level of anesthesia had been achieved, a small Kocher incision was made with #15 blade. Dissection was carried through subcutaneous tissues and platysma. Hemostasis was achieved with the electrocautery. Skin flaps were elevated cephalad and caudad from the thyroid notch to the sternal notch. A self-retaining  retractor was placed for exposure. Strap muscles were incised in the midline and dissection was begun on the right side. Strap muscles were reflected laterally. The right thyroid lobe was moderately enlarged, rounded, and dominated by a large nodule. The lobe was gently mobilized with blunt dissection. Superior pole vessels were dissected out and divided individually between small and medium ligaclips with the harmonic scalpel. The thyroid lobe was rolled anteriorly. Branches of the inferior thyroid artery were divided between small ligaclips with the harmonic scalpel. Inferior venous tributaries were divided between ligaclips. Both the superior and inferior parathyroid glands were identified and preserved on their vascular pedicles. The recurrent laryngeal nerve was identified and preserved along its course. The ligament of Gwenlyn Found was released with the electrocautery and the gland was mobilized onto the anterior trachea. Isthmus was mobilized across the midline. There was no pyramidal lobe present. The thyroid parenchyma was transected at the junction of the isthmus and contralateral thyroid lobe with the harmonic scalpel. The thyroid lobe and isthmus were submitted to pathology for review.  The neck was irrigated with warm saline. Fibrillar was placed throughout the operative field. Strap muscles were approximated in the midline with interrupted 3-0 Vicryl sutures. Platysma was closed with interrupted 3-0 Vicryl sutures. Skin was closed with a running 4-0 Monocryl subcuticular suture.  Wound was washed and dried and Dermabond was applied. The patient was awakened from anesthesia and brought to the recovery room. The patient tolerated the procedure well.   Armandina Gemma, MD [facility] Office: [phone]

## 2018-09-01 NOTE — Anesthesia Postprocedure Evaluation (Signed)
Anesthesia Post Note  Patient: Kelli Carroll  Procedure(s) Performed: RIGHT THYROID LOBECTOMY (Right Neck)     Patient location during evaluation: PACU Anesthesia Type: General Level of consciousness: awake and alert Pain management: pain level controlled Vital Signs Assessment: post-procedure vital signs reviewed and stable Respiratory status: spontaneous breathing, nonlabored ventilation, respiratory function stable and patient connected to nasal cannula oxygen Cardiovascular status: blood pressure returned to baseline and stable Postop Assessment: no apparent nausea or vomiting Anesthetic complications: no    Last Vitals:  Vitals:   09/01/18 1632 09/01/18 1805  BP: 140/79 140/76  Pulse: 99 88  Resp: 16 18  Temp:  36.7 C  SpO2: 96% 99%    Last Pain:  Vitals:   09/01/18 1805  TempSrc: Axillary  PainSc:                  Audry Pili

## 2018-09-02 ENCOUNTER — Encounter (HOSPITAL_COMMUNITY): Payer: Self-pay | Admitting: Surgery

## 2018-09-02 DIAGNOSIS — D34 Benign neoplasm of thyroid gland: Secondary | ICD-10-CM | POA: Diagnosis not present

## 2018-09-02 MED ORDER — TRAMADOL HCL 50 MG PO TABS: 50.0000 mg | ORAL_TABLET | Freq: Four times a day (QID) | ORAL | 0 refills | Status: AC | PRN

## 2018-09-02 NOTE — Progress Notes (Signed)
Discharge and medication instructions reviewed with patient. Questions answered and patient and her mother deny further questions. Mother is driving patient home. No prescriptions given to patient. Donne Hazel, RN

## 2018-09-02 NOTE — Discharge Summary (Signed)
Physician Discharge Summary [facility]  Patient ID: Kelli Carroll MRN: [id number] DOB/AGE: 44-Oct-1977 44 y.o.  Admit date: 09/01/2018 Discharge date: 09/02/2018  Admission Diagnoses:  Right thyroid nodule, thyroid neoplasm of uncertain behavior  Discharge Diagnoses:  Principal Problem:   Neoplasm of uncertain behavior of thyroid gland   Discharged Condition: good  Hospital Course: Patient was admitted for observation following thyroid surgery.  Post op course was uncomplicated.  Pain was well controlled.  Tolerated diet.  Patient was prepared for discharge home on POD#1.  Consults: None  Treatments: surgery: right thyroid lobectomy  Discharge Exam: Blood pressure 126/73, pulse 81, temperature 97.7 F (36.5 C), temperature source Oral, resp. rate 16, SpO2 100 %. HEENT - clear Neck - wound dry and intact; mild STS; voice slightly hoarse Chest - clear bilaterally Cor - RRR   Disposition: Home  Discharge Instructions    Diet - low sodium heart healthy   Complete by: As directed    Discharge instructions   Complete by: As directed    [facility]  THYROID & PARATHYROID SURGERY:  POST-OP INSTRUCTIONS  Always review your discharge instruction sheet from the facility where your surgery was performed.  A prescription for pain medication may be given to you upon discharge.  Take your pain medication as prescribed.  If narcotic pain medicine is not needed, then you may take acetaminophen (Tylenol) or ibuprofen (Advil) as needed.  Take your usually prescribed medications unless otherwise directed.  If you need a refill on your pain medication, please contact our office during regular business hours.  Prescriptions cannot be processed by our office after 5 pm or on weekends.  Start with a light diet upon arrival home, such as soup and crackers or toast.  Be sure to drink plenty of fluids daily.  Resume your normal diet the day after  surgery.  Most patients will experience some swelling and bruising on the chest and neck area.  Ice packs will help.  Swelling and bruising can take several days to resolve.   It is common to experience some constipation after surgery.  Increasing fluid intake and taking a stool softener (Colace) will usually help or prevent this problem.  A mild laxative (Milk of Magnesia or Miralax) should be taken according to package directions if there has been no bowel movement after 48 hours.  You have steri-strips and a gauze dressing over your incision.  You may remove the gauze bandage on the second day after surgery, and you may shower at that time.  Leave your steri-strips (small skin tapes) in place directly over the incision.  These strips should remain on the skin for 5-7 days and then be removed.  You may get them wet in the shower and pat them dry.  You may resume regular (light) daily activities beginning the next day (such as daily self-care, walking, climbing stairs) gradually increasing activities as tolerated.  You may have sexual intercourse when it is comfortable.  Refrain from any heavy lifting or straining until approved by your doctor.  You may drive when you no longer are taking prescription pain medication, you can comfortably wear a seatbelt, and you can safely maneuver your car and apply brakes.  You should see your doctor in the office for a follow-up appointment approximately three weeks after your surgery.  Make sure that you call for this appointment within a day or two after you arrive home to insure a convenient appointment time.  WHEN TO CALL YOUR DOCTOR: -- Fever greater than 101.5 -- Inability to urinate -- Nausea and/or vomiting - persistent -- Extreme swelling or bruising -- Continued bleeding from incision -- Increased pain, redness, or drainage from the incision -- Difficulty swallowing or breathing -- Muscle cramping or spasms -- Numbness or tingling in hands or  around lips  The clinic staff is available to answer your questions during regular business hours.  Please don't hesitate to call and ask to speak to one of the nurses if you have concerns.  Armandina Gemma, MD [facility] Office: [phone]   Increase activity slowly   Complete by: As directed    No dressing needed   Complete by: As directed      Allergies as of 09/02/2018   No Known Allergies     Medication List    TAKE these medications   acetaminophen 500 MG tablet Commonly known as: TYLENOL Take 1,000 mg by mouth every 6 (six) hours as needed for moderate pain.   amLODipine 10 MG tablet Commonly known as: NORVASC Take 10 mg by mouth daily.   amoxicillin 500 MG capsule Commonly known as: AMOXIL Take 1 capsule (500 mg total) by mouth 3 (three) times daily.   cetirizine 10 MG tablet Commonly known as: ZYRTEC Take 10 mg by mouth at bedtime.   chlorthalidone 25 MG tablet Commonly known as: HYGROTON Take 25 mg by mouth daily.   dicyclomine 10 MG capsule Commonly known as: BENTYL Take 10 mg by mouth 2 (two) times daily.   esomeprazole 40 MG capsule Commonly known as: NEXIUM Take 40 mg by mouth daily.   guaiFENesin-codeine 100-10 MG/5ML syrup Take 5 mLs by mouth 3 (three) times daily as needed for cough.   ibuprofen 200 MG tablet Commonly known as: ADVIL Take 400 mg by mouth every 6 (six) hours as needed for moderate pain.   naproxen 500 MG tablet Commonly known as: NAPROSYN Take 1 tablet (500 mg total) by mouth 2 (two) times daily.   potassium chloride 10 MEQ tablet Commonly known as: K-DUR Take 1 tablet (10 mEq total) by mouth daily.   potassium chloride SA 20 MEQ tablet Commonly known as: K-DUR Take 20 mEq by mouth 2 (two) times daily.   traMADol 50 MG tablet Commonly known as: ULTRAM Take 1-2 tablets (50-100 mg total) by mouth every 6 (six) hours as needed.   venlafaxine XR 37.5 MG 24 hr capsule Commonly known as: EFFEXOR-XR  Take 37.5 mg by mouth daily with breakfast.   Vitamin D 50 MCG (2000 UT) Caps Take 2,000 Units by mouth daily.        Earnstine Regal, MD, [facility] Office: [phone]   Signed: Armandina Gemma 09/02/2018, 9:33 AM

## 2018-09-07 NOTE — Progress Notes (Signed)
Please contact patient and notify of benign pathology results.  Yesha Muchow M. Ela Moffat, MD, FACS [facility] Office: [phone]   

## 2018-10-12 ENCOUNTER — Ambulatory Visit
Admission: RE | Admit: 2018-10-12 | Discharge: 2018-10-12 | Disposition: A | Discharge status: Home / Self Care | Payer: Medicaid Other | Source: Ambulatory Visit | Attending: Family Medicine | Admitting: Family Medicine

## 2018-10-12 ENCOUNTER — Other Ambulatory Visit: Payer: Self-pay

## 2018-10-12 DIAGNOSIS — Z1231 Encounter for screening mammogram for malignant neoplasm of breast: Secondary | ICD-10-CM

## 2019-09-22 IMAGING — US ULTRASOUND FNA BIOPSY THYROID 1ST LESION
1 series · 13 of 22 positions shown · non-contrast
Comparison: Thyroid ultrasound dated 06/02/2018

MEDICATIONS:
None

COMPLICATIONS:
None immediate.

INDICATION: Patient with history of palpable right thyroid nodule on physical
exam and subsequent thyroid ultrasound on 06/02/2018 which revealed
a 4.8 cm mixed cystic and solid nodule in the right mid lobe which
meets criteria for biopsy. She presents today for the procedure.

EXAM:
ULTRASOUND GUIDED FINE NEEDLE ASPIRATION BIOPSY OF RIGHT MID THYROID
SOLID/CYSTIC NODULE
TECHNIQUE: Informed written consent was obtained from the patient after a
discussion of the risks, benefits and alternatives to treatment.
Questions regarding the procedure were encouraged and answered. A
timeout was performed prior to the initiation of the procedure.

[Series 1: ultrasound fna biopsy thyroid 1st lesion · 0.07mm/px · 22 acquisitions, 13 frames shown]
[im 1/22]
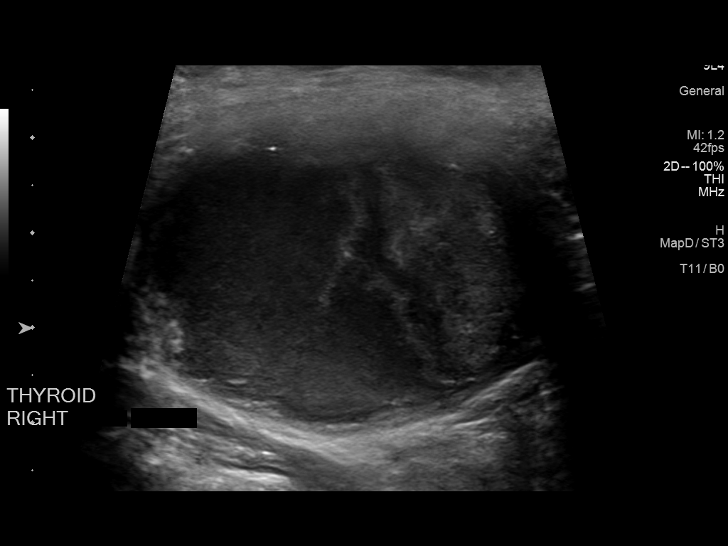
[im 3/22]
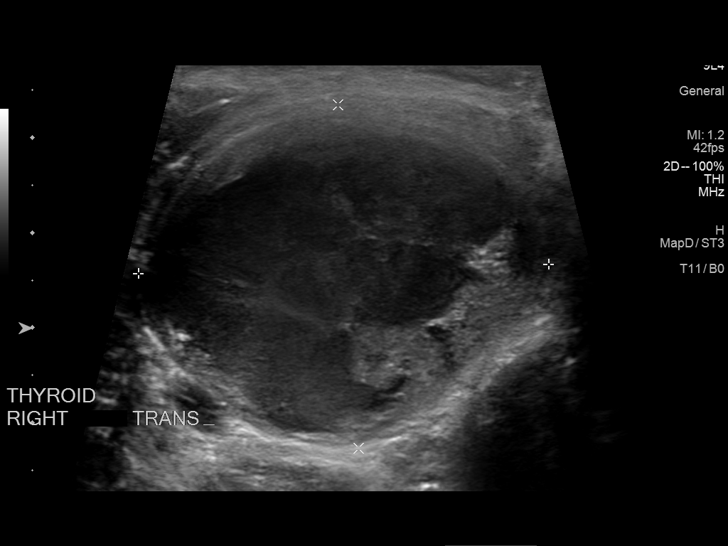
[im 5/22]
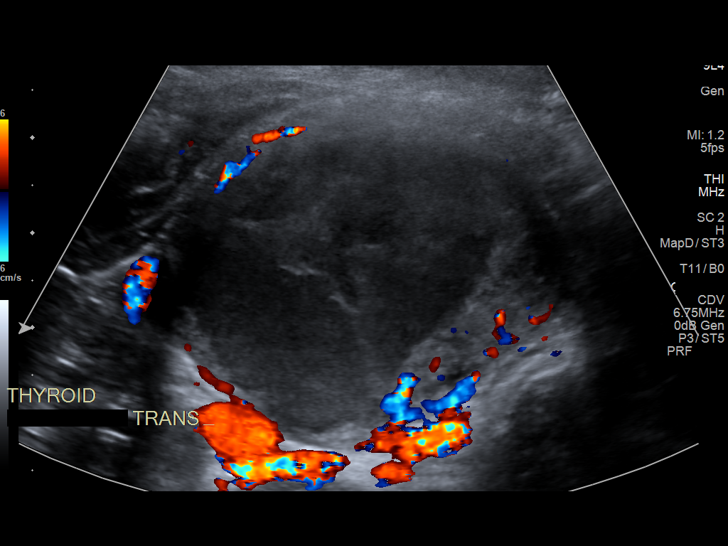
[im 6/22]
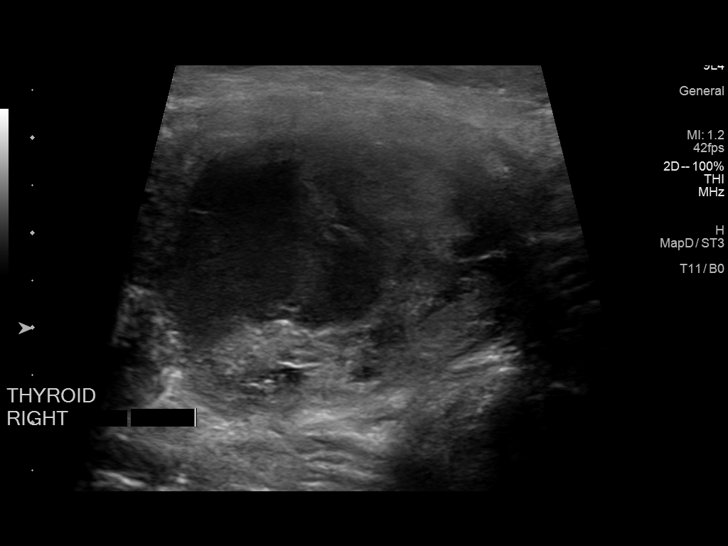
[im 8/22]
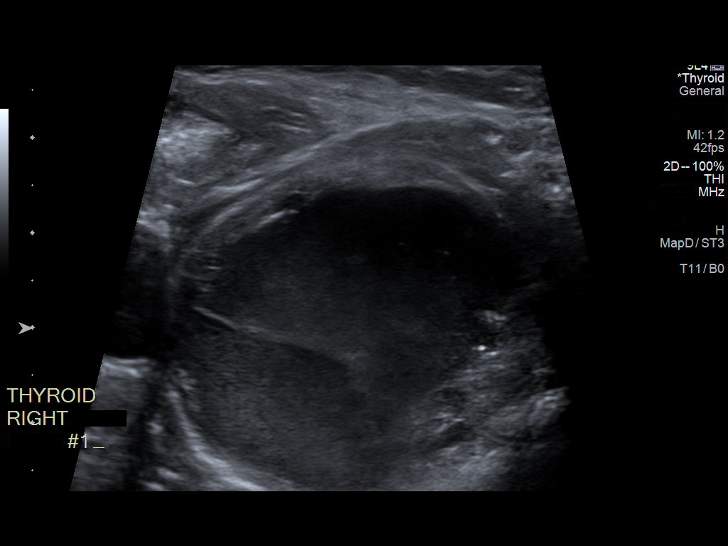
[im 10/22]
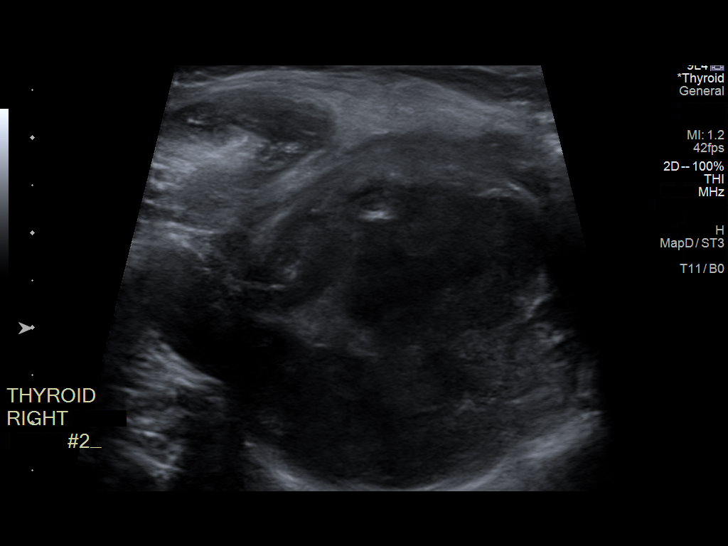
[im 12/22]
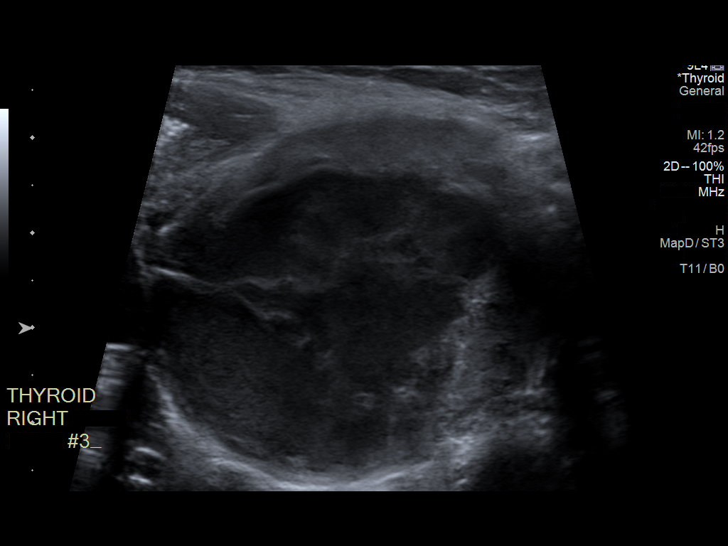
[im 13/22]
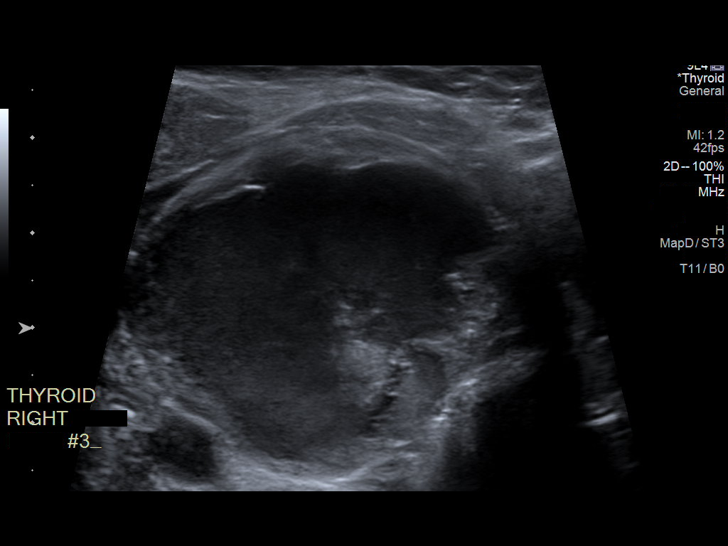
[im 15/22]
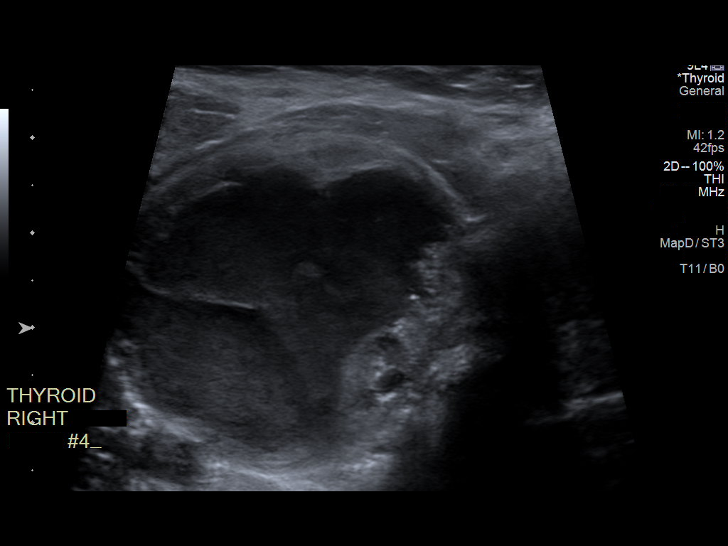
[im 17/22]
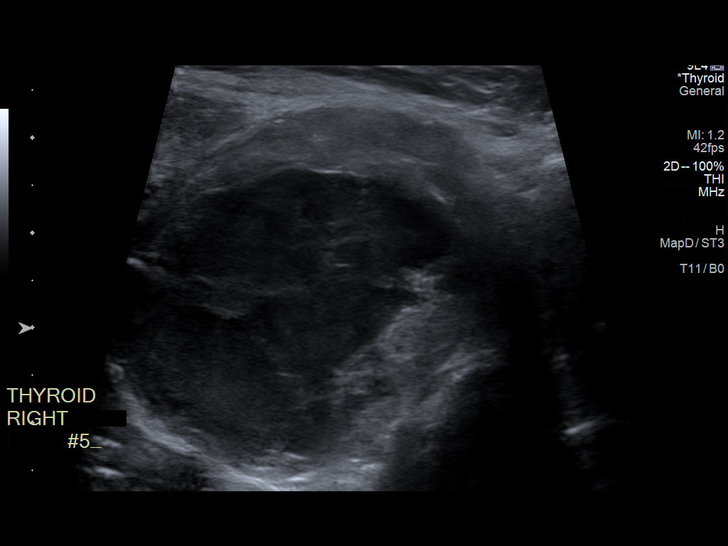
[im 18/22]
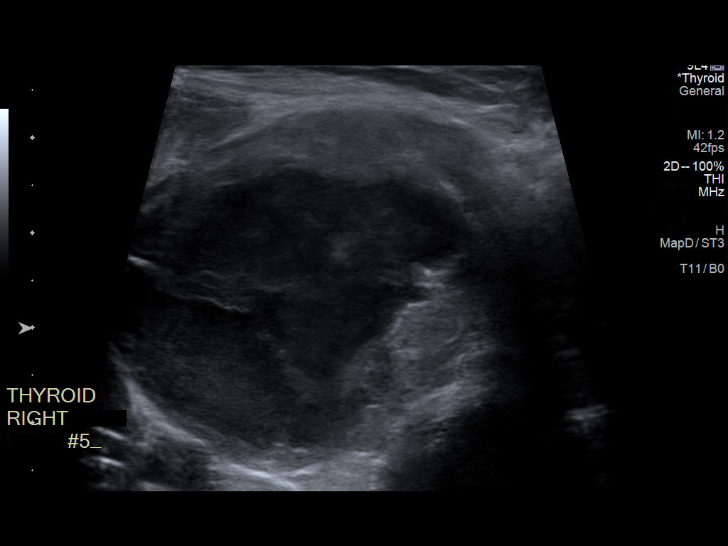
[im 20/22]
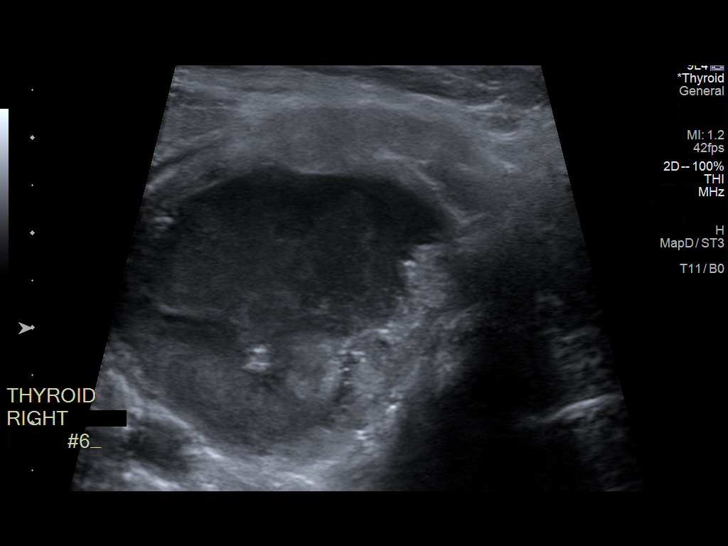
[im 22/22]
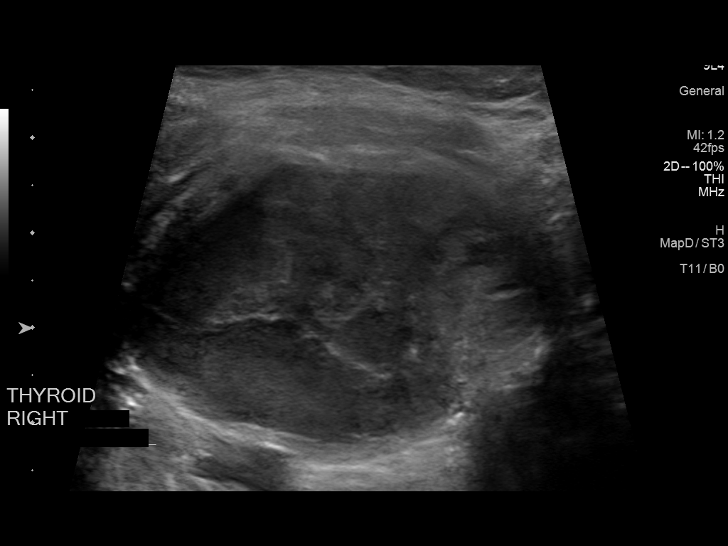

[13 of 22 positions shown; findings below may reference images not displayed]

Pre-procedural ultrasound scanning demonstrated unchanged size and
appearance of the indeterminate nodule within the right mid thyroid
lobe

The procedure was planned. The neck was prepped in the usual sterile
fashion, and a sterile drape was applied covering the operative
field. A timeout was performed prior to the initiation of the
procedure. Local anesthesia was provided with 1% lidocaine.

Under direct ultrasound guidance, 6 FNA biopsies were performed of
the right mid thyroid nodule with 25 gauge needles. Approximately 3
cc of dark brown fluid were aspirated from cystic component.
Multiple ultrasound images were saved for procedural documentation
purposes. The samples were prepared and submitted to pathology as
well as for Afirma testing.

Limited post procedural scanning was negative for hematoma or
additional complication. Dressings were placed. The patient
tolerated the above procedures procedure well without immediate
postprocedural complication.
FINDINGS: Nodule reference number based on prior diagnostic ultrasound: 1

Maximum size: 4.8 cm

Location: Right; Mid

ACR TI-RADS risk category: TR3 (3 points)

Reason for biopsy: meets ACR TI-RADS criteria

Ultrasound imaging confirms appropriate placement of the needles
within the thyroid nodule.
IMPRESSION: Technically successful ultrasound guided fine needle aspiration
biopsy of solid/cystic right mid thyroid nodule. Final pathology
pending.

## 2020-01-12 IMAGING — MG MM DIGITAL SCREENING BILAT W/ TOMO W/ CAD
6 of 12 series · 6 of 36 positions shown · non-contrast
Comparison: None.

CLINICAL DATA: Screening.

EXAM:
DIGITAL SCREENING BILATERAL MAMMOGRAM WITH TOMO AND CAD

[R MLO synth-2D (1 of 2)]
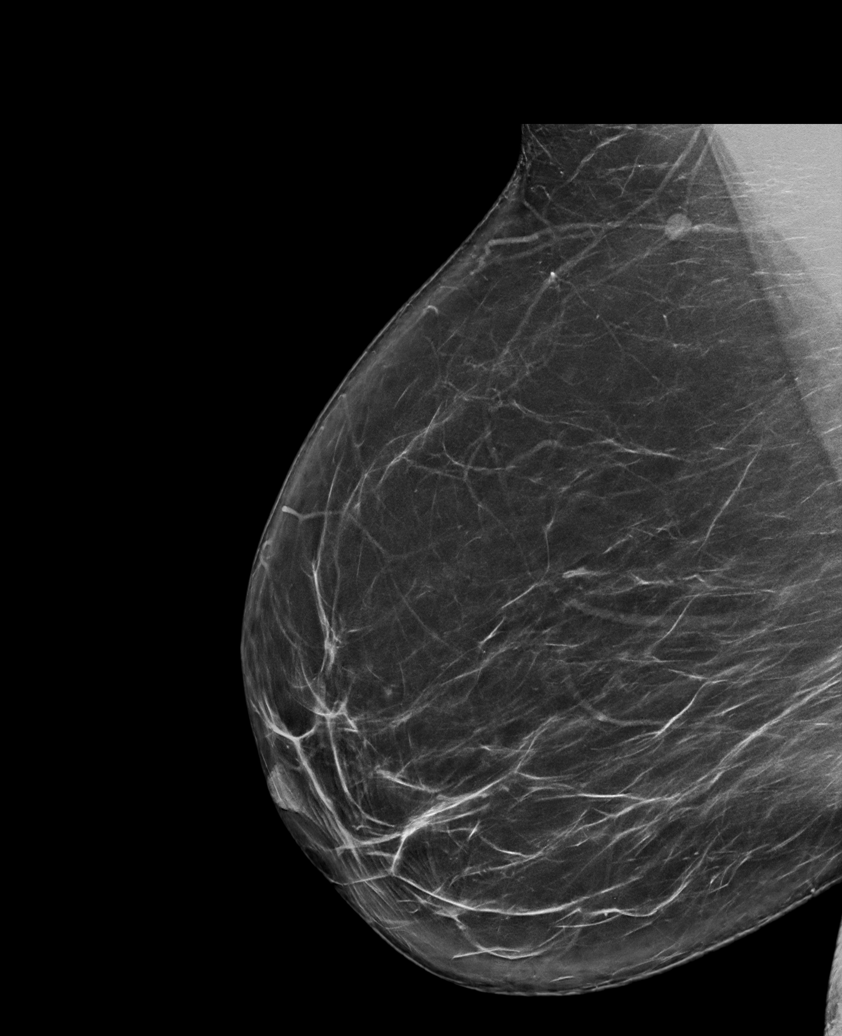

[R MLO synth-2D (2 of 2)]
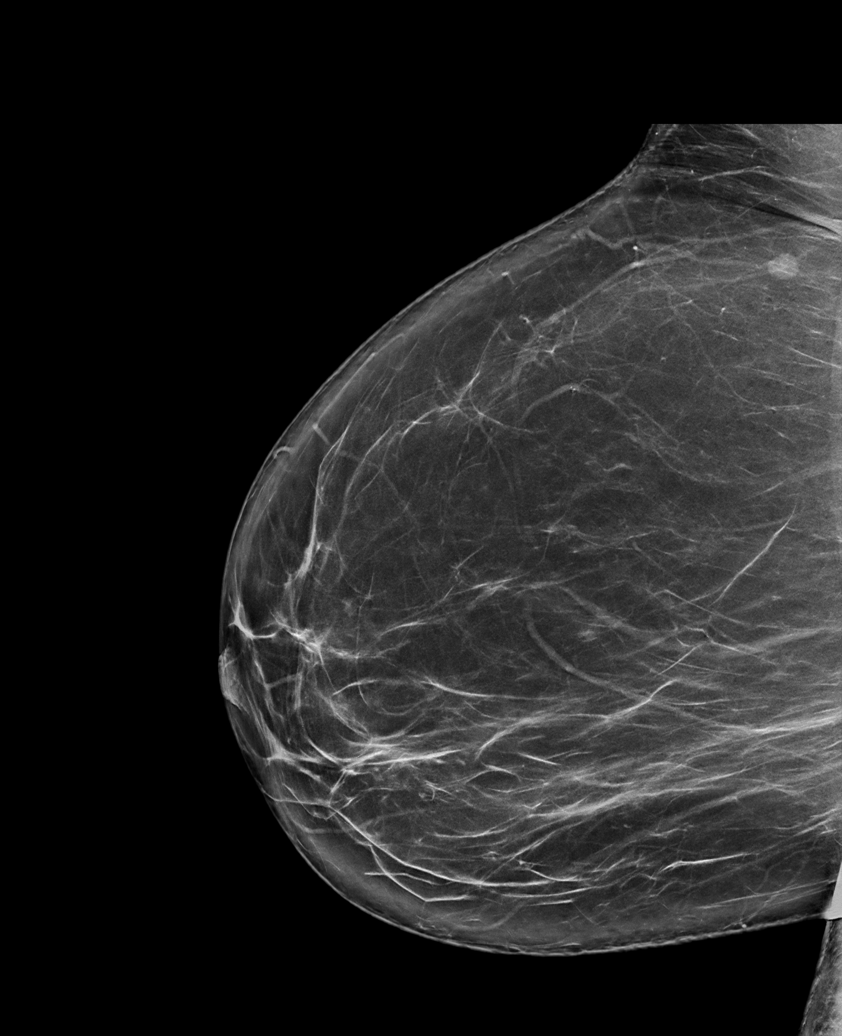

[L CC synth-2D]
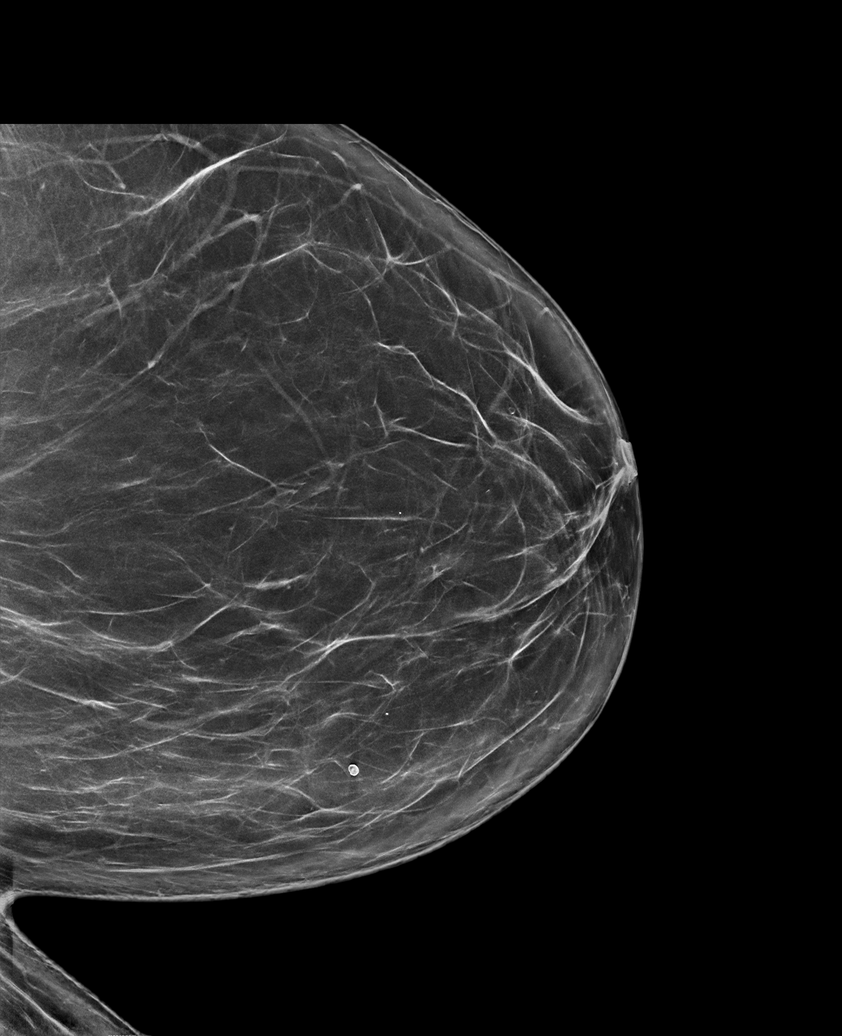

[R CC synth-2D (1 of 2)]
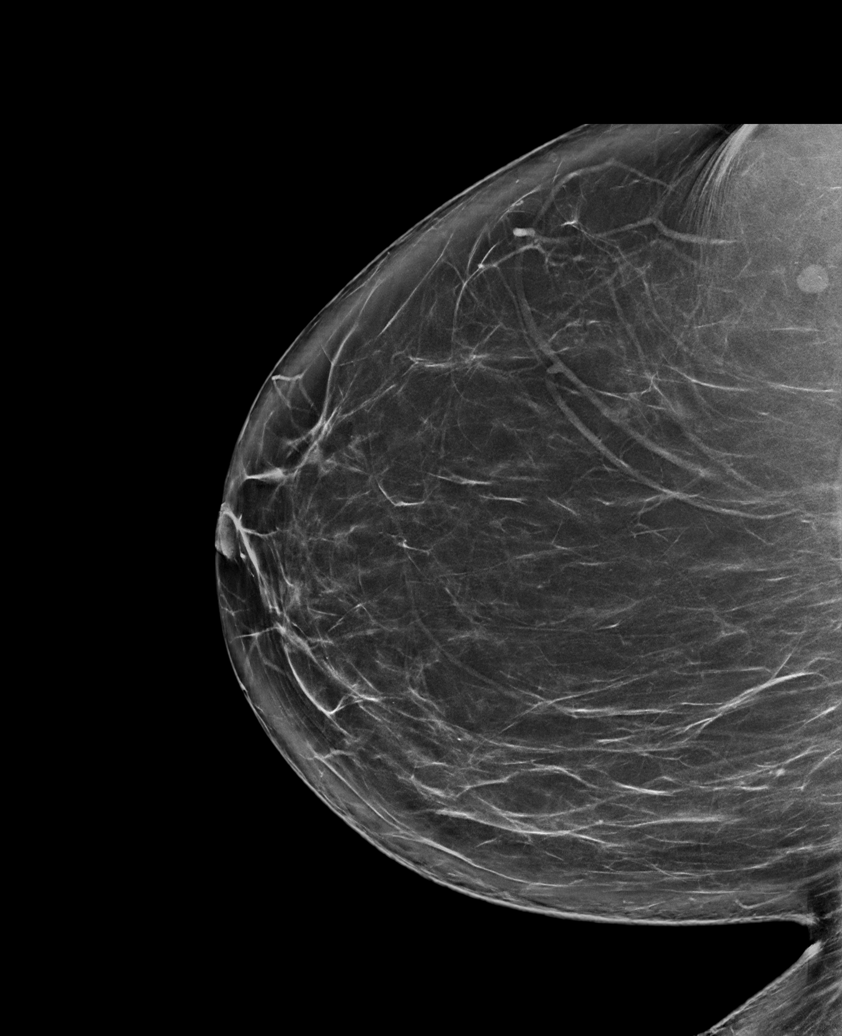

[R CC synth-2D (2 of 2)]
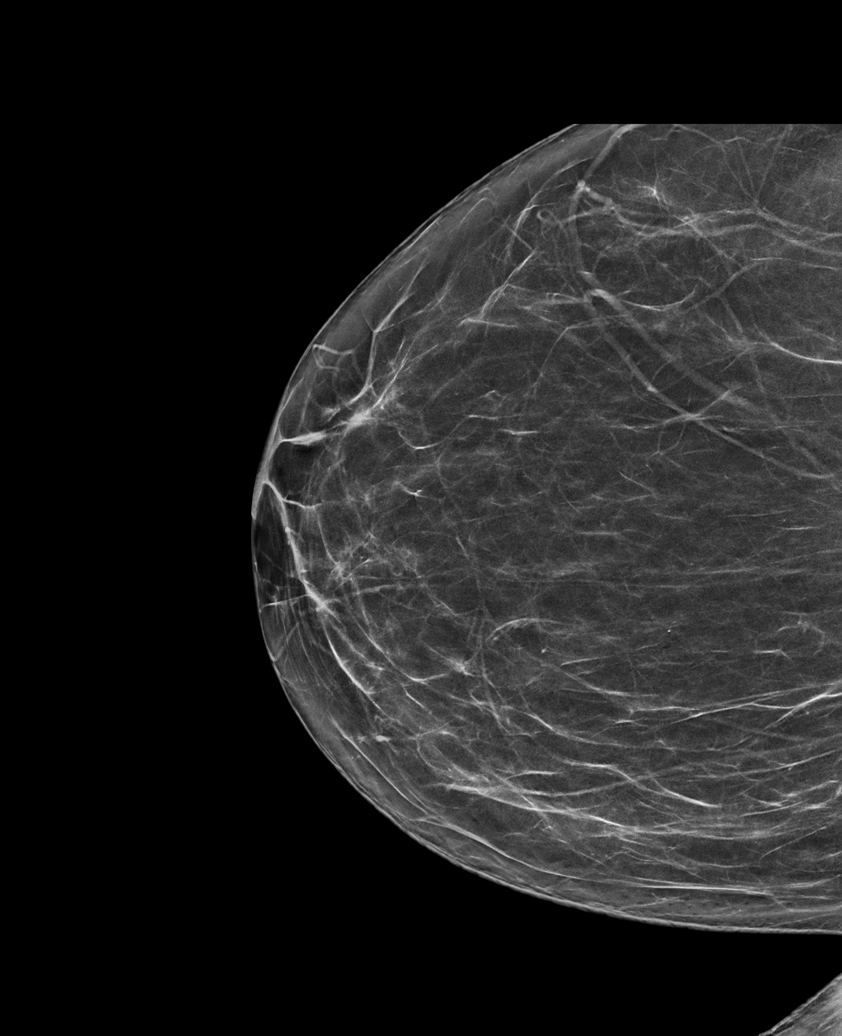

[L MLO synth-2D]
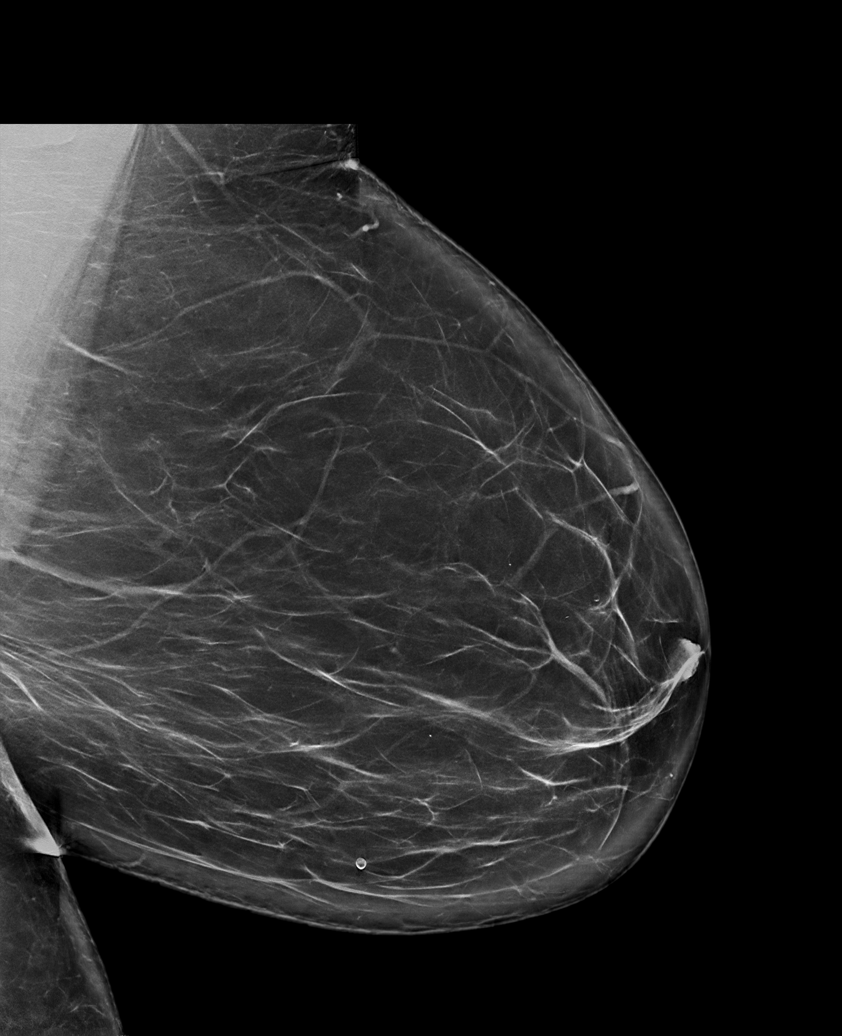

[6 of 36 positions shown; findings below may reference images not displayed]

ACR Breast Density Category b: There are scattered areas of
fibroglandular density.
FINDINGS: There are no findings suspicious for malignancy. Images were
processed with CAD.
IMPRESSION: No mammographic evidence of malignancy. A result letter of this
screening mammogram will be mailed directly to the patient.

RECOMMENDATION:
Screening mammogram in one year. (Code:Y5-G-EJ6)

BI-RADS CATEGORY  1: Negative.

## 2020-05-27 ENCOUNTER — Other Ambulatory Visit: Payer: Self-pay

## 2020-05-27 ENCOUNTER — Emergency Department (HOSPITAL_BASED_OUTPATIENT_CLINIC_OR_DEPARTMENT_OTHER)
Admission: EM | Admit: 2020-05-27 | Discharge: 2020-05-28 | Disposition: A | Discharge status: Home / Self Care | Payer: Medicaid Other | Attending: Emergency Medicine | Admitting: Emergency Medicine

## 2020-05-27 ENCOUNTER — Emergency Department (HOSPITAL_BASED_OUTPATIENT_CLINIC_OR_DEPARTMENT_OTHER): Payer: Medicaid Other

## 2020-05-27 ENCOUNTER — Encounter (HOSPITAL_BASED_OUTPATIENT_CLINIC_OR_DEPARTMENT_OTHER): Payer: Self-pay | Admitting: *Deleted

## 2020-05-27 DIAGNOSIS — Z20822 Contact with and (suspected) exposure to covid-19: Secondary | ICD-10-CM | POA: Insufficient documentation

## 2020-05-27 DIAGNOSIS — Z85850 Personal history of malignant neoplasm of thyroid: Secondary | ICD-10-CM | POA: Diagnosis not present

## 2020-05-27 DIAGNOSIS — R1013 Epigastric pain: Secondary | ICD-10-CM | POA: Diagnosis not present

## 2020-05-27 DIAGNOSIS — D72829 Elevated white blood cell count, unspecified: Secondary | ICD-10-CM | POA: Insufficient documentation

## 2020-05-27 DIAGNOSIS — R000 Tachycardia, unspecified: Secondary | ICD-10-CM | POA: Insufficient documentation

## 2020-05-27 DIAGNOSIS — E876 Hypokalemia: Secondary | ICD-10-CM | POA: Diagnosis not present

## 2020-05-27 DIAGNOSIS — Z79899 Other long term (current) drug therapy: Secondary | ICD-10-CM | POA: Diagnosis not present

## 2020-05-27 DIAGNOSIS — I10 Essential (primary) hypertension: Secondary | ICD-10-CM | POA: Insufficient documentation

## 2020-05-27 DIAGNOSIS — R112 Nausea with vomiting, unspecified: Secondary | ICD-10-CM | POA: Diagnosis not present

## 2020-05-27 DIAGNOSIS — K219 Gastro-esophageal reflux disease without esophagitis: Secondary | ICD-10-CM | POA: Insufficient documentation

## 2020-05-27 DIAGNOSIS — N12 Tubulo-interstitial nephritis, not specified as acute or chronic: Secondary | ICD-10-CM

## 2020-05-27 LAB — COMPREHENSIVE METABOLIC PANEL
ALT: 23 U/L (ref 0–44)
AST: 29 U/L (ref 15–41)
Albumin: 4 g/dL (ref 3.5–5.0)
Alkaline Phosphatase: 77 U/L (ref 38–126)
Anion gap: 13 (ref 5–15)
BUN: 19 mg/dL (ref 6–20)
CO2: 27 mmol/L (ref 22–32)
Calcium: 8.6 mg/dL — ABNORMAL LOW (ref 8.9–10.3)
Chloride: 95 mmol/L — ABNORMAL LOW (ref 98–111)
Creatinine, Ser: 1.89 mg/dL — ABNORMAL HIGH (ref 0.44–1.00)
GFR, Estimated: 33 mL/min — ABNORMAL LOW (ref >60)
Glucose, Bld: 115 mg/dL — ABNORMAL HIGH (ref 70–99)
Potassium: 2.7 mmol/L — CL (ref 3.5–5.1)
Sodium: 135 mmol/L (ref 135–145)
Total Bilirubin: 0.9 mg/dL (ref 0.3–1.2)
Total Protein: 8.4 g/dL — ABNORMAL HIGH (ref 6.5–8.1)

## 2020-05-27 LAB — HCG, SERUM, QUALITATIVE: Preg, Serum: NEGATIVE

## 2020-05-27 LAB — CBC WITH DIFFERENTIAL/PLATELET
Abs Immature Granulocytes: 0.1 10*3/uL — ABNORMAL HIGH (ref 0.00–0.07)
Basophils Absolute: 0 10*3/uL (ref 0.0–0.1)
Basophils Relative: 0 %
Eosinophils Absolute: 0 10*3/uL (ref 0.0–0.5)
Eosinophils Relative: 0 %
HCT: 38.4 % (ref 36.0–46.0)
Hemoglobin: 12.8 g/dL (ref 12.0–15.0)
Immature Granulocytes: 1 %
Lymphocytes Relative: 5 %
Lymphs Abs: 0.9 10*3/uL (ref 0.7–4.0)
MCH: 29.1 pg (ref 26.0–34.0)
MCHC: 33.3 g/dL (ref 30.0–36.0)
MCV: 87.3 fL (ref 80.0–100.0)
Monocytes Absolute: 1.5 10*3/uL — ABNORMAL HIGH (ref 0.1–1.0)
Monocytes Relative: 9 %
Neutro Abs: 15 10*3/uL — ABNORMAL HIGH (ref 1.7–7.7)
Neutrophils Relative %: 85 %
Platelets: 270 10*3/uL (ref 150–400)
RBC: 4.4 MIL/uL (ref 3.87–5.11)
RDW: 12.6 % (ref 11.5–15.5)
WBC: 17.6 10*3/uL — ABNORMAL HIGH (ref 4.0–10.5)
nRBC: 0 % (ref 0.0–0.2)

## 2020-05-27 LAB — URINALYSIS, MICROSCOPIC (REFLEX): WBC, UA: >50 WBC/hpf (ref 0–5)

## 2020-05-27 LAB — URINALYSIS, ROUTINE W REFLEX MICROSCOPIC
Glucose, UA: 100 mg/dL — AB
Ketones, ur: NEGATIVE mg/dL
Nitrite: NEGATIVE
Protein, ur: 100 mg/dL — AB
Specific Gravity, Urine: 1.025 (ref 1.005–1.030)
pH: 5 (ref 5.0–8.0)

## 2020-05-27 LAB — MAGNESIUM: Magnesium: 1.5 mg/dL — ABNORMAL LOW (ref 1.7–2.4)

## 2020-05-27 LAB — RESP PANEL BY RT-PCR (FLU A&B, COVID) ARPGX2
Influenza A by PCR: NEGATIVE
Influenza B by PCR: NEGATIVE
SARS Coronavirus 2 by RT PCR: NEGATIVE

## 2020-05-27 LAB — LIPASE, BLOOD: Lipase: 22 U/L (ref 11–51)

## 2020-05-27 MED ORDER — SODIUM CHLORIDE 0.9 % IV SOLN: INTRAVENOUS | Status: DC | PRN

## 2020-05-27 MED ORDER — SODIUM CHLORIDE 0.9 % IV SOLN
1.0000 g | Freq: Once | INTRAVENOUS | Status: AC
  Administered 2020-05-27: 1 g via INTRAVENOUS
  Filled 2020-05-27: qty 10

## 2020-05-27 MED ORDER — METOCLOPRAMIDE HCL 5 MG/ML IJ SOLN
5.0000 mg | Freq: Once | INTRAMUSCULAR | Status: AC
  Administered 2020-05-27: 5 mg via INTRAVENOUS
  Filled 2020-05-27: qty 2

## 2020-05-27 MED ORDER — MAGNESIUM SULFATE 2 GM/50ML IV SOLN
2.0000 g | Freq: Once | INTRAVENOUS | Status: AC
  Administered 2020-05-27: 2 g via INTRAVENOUS
  Filled 2020-05-27: qty 50

## 2020-05-27 MED ORDER — PANTOPRAZOLE SODIUM 40 MG IV SOLR
40.0000 mg | Freq: Once | INTRAVENOUS | Status: AC
  Administered 2020-05-27: 40 mg via INTRAVENOUS
  Filled 2020-05-27: qty 40

## 2020-05-27 MED ORDER — CEPHALEXIN 500 MG PO CAPS: 500.0000 mg | ORAL_CAPSULE | Freq: Four times a day (QID) | ORAL | 0 refills | Status: AC

## 2020-05-27 MED ORDER — ONDANSETRON 4 MG PO TBDP: 4.0000 mg | ORAL_TABLET | Freq: Three times a day (TID) | ORAL | 0 refills | Status: AC | PRN

## 2020-05-27 MED ORDER — ONDANSETRON HCL 4 MG/2ML IJ SOLN
4.0000 mg | Freq: Once | INTRAMUSCULAR | Status: AC
  Administered 2020-05-27: 4 mg via INTRAVENOUS
  Filled 2020-05-27: qty 2

## 2020-05-27 MED ORDER — POTASSIUM CHLORIDE 10 MEQ/100ML IV SOLN
10.0000 meq | INTRAVENOUS | Status: AC
  Administered 2020-05-27 (×3): 10 meq via INTRAVENOUS
  Filled 2020-05-27 (×3): qty 100

## 2020-05-27 MED ORDER — IOHEXOL 300 MG/ML  SOLN
100.0000 mL | Freq: Once | INTRAMUSCULAR | Status: AC | PRN
  Administered 2020-05-27: 100 mL via INTRAVENOUS

## 2020-05-27 MED ORDER — SODIUM CHLORIDE 0.9 % IV BOLUS
1000.0000 mL | Freq: Once | INTRAVENOUS | Status: AC
  Administered 2020-05-27: 1000 mL via INTRAVENOUS

## 2020-05-27 MED ORDER — MORPHINE SULFATE (PF) 4 MG/ML IV SOLN
4.0000 mg | Freq: Once | INTRAVENOUS | Status: AC
  Administered 2020-05-27: 4 mg via INTRAVENOUS
  Filled 2020-05-27: qty 1

## 2020-05-27 NOTE — Discharge Instructions (Addendum)
I am prescribing you 2 medications.  First medication is an antibiotic called Keflex.  You need to take this 4 times a day for the next 10 days.  Do not stop taking this medication early even if you feel that your symptoms have resolved.  I am also prescribing you a medication called Zofran.  You can take this for nausea and vomiting up to 3 times a day.  Only take this if you are experiencing nausea and vomiting that you cannot control.  Please continue to monitor your symptoms very closely.  If you develop worsening fevers, nausea/vomiting, or generally feel significantly worse, come back to the emergency department immediately so that you can be reevaluated.  It was a pleasure to meet you.

## 2020-05-27 NOTE — ED Provider Notes (Signed)
Patient is a 45 year old female whose care was transferred to me at shift change from [facility].  His HPI is below:  Patient is a 45 year old female with a past medical history significant for anxiety, depression, HTN, reflux, migraines, diabetes, sleep apnea  Patient is presented today with chief complaint of epigastric abdominal pain she states is sharp and stabbing does not seem to be associated with food exertion and there is no shortness of breath or chest pain associated with this.  She states that it has been ongoing for 3 days has been relatively constant with only brief episodes of decreased pain.  She is not taking any medications that it did not improve her symptoms.  She states she has a history of reflux but this feels quite different.  She denies any lower abdominal pain, dysuria, frequency, urgency, hematuria, no vaginal discharge or dyspareunia, she denies any fevers or chills.  She also denies any cough or congestion.  She states she does have associated nausea.  Patient has had inability to tolerate p.o. with vomiting anytime she eats anything including crackers.  She states it has been nonbloody nonbilious.  Physical Exam  BP 105/72   Pulse (!) 104   Temp 98.8 F (37.1 C) (Oral)   Resp 18   Ht 5\' 2"  (1.575 m)   Wt 127 kg   SpO2 99%   BMI 51.21 kg/m   Physical Exam Vitals and nursing note reviewed.  Constitutional:      General: She is in acute distress.     Comments: Pleasant well-appearing 45 year old.  Somewhat uncomfortable appearing able answer questions appropriately follow commands. No increased work of breathing. Speaking in full sentences.  HENT:     Head: Normocephalic and atraumatic.     Nose: Nose normal.  Eyes:     General: No scleral icterus. Cardiovascular:     Rate and Rhythm: Normal rate and regular rhythm.     Pulses: Normal pulses.     Heart sounds: Normal heart sounds.  Pulmonary:     Effort: Pulmonary effort is normal. No respiratory  distress.     Breath sounds: No wheezing.  Abdominal:     Palpations: Abdomen is soft.     Tenderness: There is abdominal tenderness.     Comments: Epigastric tenderness.  No guarding or rebound.  No other significant focal abdominal tenderness but there is some diffuse periumbilical mild tenderness to palpation.  Musculoskeletal:     Cervical back: Normal range of motion.     Right lower leg: No edema.     Left lower leg: No edema.  Skin:    General: Skin is warm and dry.     Capillary Refill: Capillary refill takes less than 2 seconds.  Neurological:     Mental Status: She is alert. Mental status is at baseline.  Psychiatric:        Mood and Affect: Mood normal.        Behavior: Behavior normal.  ED Course/Procedures     Procedures  MDM  Patient is a 45 year old female whose care was transferred to me at shift change from [facility].  Labs: CBC with a white blood cell count of 17.6, neutrophils of 15, monocytes of 1.5. CMP with a potassium of 2.7, chloride of 95, glucose of 115, creatinine of 1.89, GFR of 33. UA shows large hemoglobin, 100 glucose, small bilirubin, moderate leukocytes, few bacteria, greater than 50 white blood cells. Magnesium of 1.5. Qualitative hCG is negative. Lipase of 22.  Respiratory panel negative. Urine culture pending.  Imaging: CT scan of the abdomen and pelvis with contrast is concerning for right-sided pyelonephritis without abscess.  I, Rayna Sexton, PA-C, personally reviewed and evaluated these images and lab results as part of my medical decision-making.  Patient given IV fluids, potassium, magnesium, antiemetics, as well as morphine.  She notes significant improvement in her symptoms.  P.o. challenged successfully.  Given patient's pyelonephritis, she was started on IV Rocephin.  Will discharge on a course of additional Keflex for the next 10 days.  Discussed the need to make sure that she finishes these antibiotics in their  entirety.  Also prescribed patient a course of Zofran for breakthrough nausea and vomiting.  Discussed her symptoms in length.  She understands that if they should worsen, that she needs to return to the emergency department immediately for reevaluation.  Feel the patient is stable for discharge at this time and she is agreeable.  Her questions were answered and she was amicable at the time of discharge.  Note: Portions of this report may have been transcribed using voice recognition software. Every effort was made to ensure accuracy; however, inadvertent computerized transcription errors may be present.             Rayna Sexton, PA-C 05/27/20 [id number]    Luna Fuse, MD 05/31/20 [phone]

## 2020-05-27 NOTE — ED Triage Notes (Signed)
C/o abd pain with n/v  x 3 days

## 2020-05-27 NOTE — ED Provider Notes (Addendum)
[facility] EMERGENCY DEPARTMENT Provider Note   CSN: [id number] Arrival date & time: 05/27/20  1821     History Chief Complaint  Patient presents with  . Abdominal Pain    Kelli Carroll is a 45 y.o. female.  HPI Patient is a 45 year old female with a past medical history significant for anxiety, depression, HTN, reflux, migraines, diabetes, sleep apnea  Patient is presented today with chief complaint of epigastric abdominal pain she states is sharp and stabbing does not seem to be associated with food exertion and there is no shortness of breath or chest pain associated with this.  She states that it has been ongoing for 3 days has been relatively constant with only brief episodes of decreased pain.  She is not taking any medications that it did not improve her symptoms.  She states she has a history of reflux but this feels quite different.  She denies any lower abdominal pain, dysuria, frequency, urgency, hematuria, no vaginal discharge or dyspareunia, she denies any fevers or chills. She also denies any cough or congestion.   She states she does have associated nausea.  Patient has had inability to tolerate p.o. with vomiting anytime she eats anything including crackers.  She states it has been nonbloody nonbilious.     Past Medical History:  Diagnosis Date  . Anxiety   . Depression   . GERD (gastroesophageal reflux disease)   . Heart murmur    "i was told i have one but ive always been told that "   . Hypertension   . Kidney stone    every now and then i catch some pain from it and they said i have a little blood in my urine   . Migraines    " every now and then but none in a while"   . Neoplasm of uncertain behavior of thyroid gland   . Prediabetes    cntorlling with diet   . Sleep apnea    severe sleep apnea , waiting on insurance to approve supplies and device      Patient Active Problem List   Diagnosis Date Noted  . Neoplasm of uncertain behavior  of thyroid gland 08/26/2018    Past Surgical History:  Procedure Laterality Date  . DILATION AND CURETTAGE OF UTERUS    . THYROID LOBECTOMY Right 09/01/2018   Procedure: RIGHT THYROID LOBECTOMY;  Surgeon: Armandina Gemma, MD;  Location: [facility];  Service: General;  Laterality: Right;  . TUBAL LIGATION      ~2010 done when she lived in [place]   . TUBAL LIGATION    . WISDOM TOOTH EXTRACTION       OB History   No obstetric history on file.     Family History  Problem Relation Age of Onset  . Breast cancer Maternal Aunt     Social History   Tobacco Use  . Smoking status: Never Smoker  . Smokeless tobacco: Never Used  Substance Use Topics  . Alcohol use: Yes  . Drug use: No    Home Medications Prior to Admission medications   Medication Sig Start Date End Date Taking? Authorizing Provider  acetaminophen (TYLENOL) 500 MG tablet Take 1,000 mg by mouth every 6 (six) hours as needed for moderate pain.    [provider]  amLODipine (NORVASC) 10 MG tablet Take 10 mg by mouth daily.     [provider]  amoxicillin (AMOXIL) 500 MG capsule Take 1 capsule (500 mg total) by mouth 3 (  three) times daily. Patient not taking: Reported on 08/25/2018 03/14/13   Montine Circle, PA-C  cetirizine (ZYRTEC) 10 MG tablet Take 10 mg by mouth at bedtime.    [provider]  chlorthalidone (HYGROTON) 25 MG tablet Take 25 mg by mouth daily.    [provider]  Cholecalciferol (VITAMIN D) 50 MCG (2000 UT) CAPS Take 2,000 Units by mouth daily.    [provider]  dicyclomine (BENTYL) 10 MG capsule Take 10 mg by mouth 2 (two) times daily.    [provider]  esomeprazole (NEXIUM) 40 MG capsule Take 40 mg by mouth daily. 05/10/18   [provider]  guaiFENesin-codeine 100-10 MG/5ML syrup Take 5 mLs by mouth 3 (three) times daily as needed for cough. Patient not taking: Reported on 08/25/2018 03/14/13   Montine Circle, PA-C  ibuprofen (ADVIL) 200  MG tablet Take 400 mg by mouth every 6 (six) hours as needed for moderate pain.    [provider]  naproxen (NAPROSYN) 500 MG tablet Take 1 tablet (500 mg total) by mouth 2 (two) times daily. Patient not taking: Reported on 08/25/2018 06/13/17   Frederica Kuster, PA-C  potassium chloride (K-DUR) 10 MEQ tablet Take 1 tablet (10 mEq total) by mouth daily. Patient not taking: Reported on 08/25/2018 06/13/17   Frederica Kuster, PA-C  potassium chloride SA (K-DUR) 20 MEQ tablet Take 20 mEq by mouth 2 (two) times daily.    [provider]  traMADol (ULTRAM) 50 MG tablet Take 1-2 tablets (50-100 mg total) by mouth every 6 (six) hours as needed. 09/02/18   Armandina Gemma, MD  venlafaxine XR (EFFEXOR-XR) 37.5 MG 24 hr capsule Take 37.5 mg by mouth daily with breakfast.    [provider]    Allergies    Patient has no known allergies.  Review of Systems   Review of Systems  Constitutional: Negative for chills and fever.  HENT: Negative for congestion.   Eyes: Negative for pain.  Respiratory: Negative for cough and shortness of breath.   Cardiovascular: Negative for chest pain and leg swelling.  Gastrointestinal: Positive for abdominal pain, nausea and vomiting. Negative for abdominal distention and diarrhea.  Genitourinary: Negative for dysuria.  Musculoskeletal: Negative for myalgias.  Skin: Negative for rash.  Neurological: Negative for dizziness and headaches.    Physical Exam Updated Vital Signs BP 105/72   Pulse (!) 104   Temp 98.8 F (37.1 C) (Oral)   Resp 18   Ht 5\' 2"  (1.575 m)   Wt 127 kg   SpO2 99%   BMI 51.21 kg/m   Physical Exam Vitals and nursing note reviewed.  Constitutional:      General: She is in acute distress.     Comments: Pleasant well-appearing 45 year old.  Somewhat uncomfortable appearing able answer questions appropriately follow commands. No increased work of breathing. Speaking in full sentences.  HENT:     Head: Normocephalic and  atraumatic.     Nose: Nose normal.  Eyes:     General: No scleral icterus. Cardiovascular:     Rate and Rhythm: Normal rate and regular rhythm.     Pulses: Normal pulses.     Heart sounds: Normal heart sounds.  Pulmonary:     Effort: Pulmonary effort is normal. No respiratory distress.     Breath sounds: No wheezing.  Abdominal:     Palpations: Abdomen is soft.     Tenderness: There is abdominal tenderness.     Comments: Tenderness left evaluation of  the epigastrium.  No guarding or rebound.  No other significant focal abdominal tenderness but there is some diffuse periumbilical mild tenderness to palpation.  Musculoskeletal:     Cervical back: Normal range of motion.     Right lower leg: No edema.     Left lower leg: No edema.  Skin:    General: Skin is warm and dry.     Capillary Refill: Capillary refill takes less than 2 seconds.  Neurological:     Mental Status: She is alert. Mental status is at baseline.  Psychiatric:        Mood and Affect: Mood normal.        Behavior: Behavior normal.     ED Results / Procedures / Treatments   Labs (all labs ordered are listed, but only abnormal results are displayed) Labs Reviewed  CBC WITH DIFFERENTIAL/PLATELET - Abnormal; Notable for the following components:      Result Value   WBC 17.6 (*)    Neutro Abs 15.0 (*)    Monocytes Absolute 1.5 (*)    Abs Immature Granulocytes 0.10 (*)    All other components within normal limits  COMPREHENSIVE METABOLIC PANEL - Abnormal; Notable for the following components:   Potassium 2.7 (*)    Chloride 95 (*)    Glucose, Bld 115 (*)    Creatinine, Ser 1.89 (*)    Calcium 8.6 (*)    Total Protein 8.4 (*)    GFR, Estimated 33 (*)    All other components within normal limits  URINALYSIS, ROUTINE W REFLEX MICROSCOPIC - Abnormal; Notable for the following components:   APPearance CLOUDY (*)    Glucose, UA 100 (*)    Hgb urine dipstick LARGE (*)    Bilirubin Urine SMALL (*)    Protein, ur  100 (*)    Leukocytes,Ua MODERATE (*)    All other components within normal limits  URINALYSIS, MICROSCOPIC (REFLEX) - Abnormal; Notable for the following components:   Bacteria, UA FEW (*)    All other components within normal limits  RESP PANEL BY RT-PCR (FLU A&B, COVID) ARPGX2  LIPASE, BLOOD  HCG, SERUM, QUALITATIVE  MAGNESIUM    EKG None  Radiology No results found.  Procedures .Critical Care Performed by: Tedd Sias, PA Authorized by: Tedd Sias, PA   Critical care provider statement:    Critical care time (minutes):  35   Critical care time was exclusive of:  Separately billable procedures and treating other patients and teaching time   Critical care was necessary to treat or prevent imminent or life-threatening deterioration of the following conditions: Severe hypokalemia.   Critical care was time spent personally by me on the following activities:  Discussions with consultants, evaluation of patient's response to treatment, examination of patient, review of old charts, re-evaluation of patient's condition, pulse oximetry, ordering and review of radiographic studies, ordering and review of laboratory studies and ordering and performing treatments and interventions   I assumed direction of critical care for this patient from another provider in my specialty: no       Medications Ordered in ED Medications  potassium chloride 10 mEq in 100 mL IVPB (has no administration in time range)  sodium chloride 0.9 % bolus 1,000 mL (1,000 mLs Intravenous New Bag/Given 05/27/20 1936)  morphine 4 MG/ML injection 4 mg (4 mg Intravenous Given 05/27/20 1938)  ondansetron (ZOFRAN) injection 4 mg (4 mg Intravenous Given 05/27/20 1937)  pantoprazole (PROTONIX) injection 40 mg (40 mg Intravenous Given  05/27/20 1942)    ED Course  I have reviewed the triage vital signs and the nursing notes.  Pertinent labs & imaging results that were available during my care of the patient were  reviewed by me and considered in my medical decision making (see chart for details).    MDM Rules/Calculators/A&P                          Patient is a 45 year old female with past medical history detailed in HPI presented today for nausea vomiting epigastric abdominal pain inability to tolerate p.o. for 2 days.  Physical exam is notable for focal epigastric abdominal tenderness. CMP notable for potassium of 2.7, creatinine has increased from 2 years ago and was within normal limits it is now 1.9.  BUN is within normal limits.  She does appear somewhat dehydrated.  She has a white count of 17.6 with a left shift.  Afebrile only mild tachycardia.  Urinalysis with few bacteria negative for nitrates but there are moderate leukocytes.  She is not having any urinary symptoms.  This may be an abnormal urinalysis secondary to some dehydration.  hCG negative for pregnancy.  Lipase within normal limits at pancreatitis.  Magnesium added on as well as COVID.  CT scan pending at this time.  Patient care handed off to [employer] Disposition pending--creatinine is increased and based on overall presentation and response to treatment she may benefit from hospitalization versus if she improves and tolerates p.o. may be reasonable for discharge.  We will provide patient with some IV potassium repletion as well while she is awaiting CT scan.  Final Clinical Impression(s) / ED Diagnoses Final diagnoses:  Epigastric pain  Hypokalemia  Non-intractable vomiting with nausea, unspecified vomiting type    Rx / DC Orders ED Discharge Orders    None       Tedd Sias, PA 05/27/20 2000    Pati Gallo [place], [place] 05/27/20 2001    Luna Fuse, MD 05/31/20 [phone]

## 2022-07-06 ENCOUNTER — Emergency Department (HOSPITAL_BASED_OUTPATIENT_CLINIC_OR_DEPARTMENT_OTHER)
Admission: EM | Admit: 2022-07-06 | Discharge: 2022-07-06 | Disposition: A | Discharge status: Home / Self Care | Payer: Medicaid Other | Attending: Emergency Medicine | Admitting: Emergency Medicine

## 2022-07-06 ENCOUNTER — Other Ambulatory Visit: Payer: Self-pay

## 2022-07-06 ENCOUNTER — Emergency Department (HOSPITAL_BASED_OUTPATIENT_CLINIC_OR_DEPARTMENT_OTHER): Payer: Medicaid Other

## 2022-07-06 ENCOUNTER — Encounter (HOSPITAL_BASED_OUTPATIENT_CLINIC_OR_DEPARTMENT_OTHER): Payer: Self-pay

## 2022-07-06 DIAGNOSIS — R0789 Other chest pain: Secondary | ICD-10-CM | POA: Insufficient documentation

## 2022-07-06 DIAGNOSIS — Z79899 Other long term (current) drug therapy: Secondary | ICD-10-CM | POA: Insufficient documentation

## 2022-07-06 DIAGNOSIS — R202 Paresthesia of skin: Secondary | ICD-10-CM | POA: Diagnosis present

## 2022-07-06 DIAGNOSIS — I10 Essential (primary) hypertension: Secondary | ICD-10-CM | POA: Diagnosis not present

## 2022-07-06 DIAGNOSIS — E876 Hypokalemia: Secondary | ICD-10-CM | POA: Insufficient documentation

## 2022-07-06 LAB — CBC WITH DIFFERENTIAL/PLATELET
Abs Immature Granulocytes: 0.02 10*3/uL (ref 0.00–0.07)
Basophils Absolute: 0 10*3/uL (ref 0.0–0.1)
Basophils Relative: 1 %
Eosinophils Absolute: 0.1 10*3/uL (ref 0.0–0.5)
Eosinophils Relative: 1 %
HCT: 37.4 % (ref 36.0–46.0)
Hemoglobin: 12.2 g/dL (ref 12.0–15.0)
Immature Granulocytes: 0 %
Lymphocytes Relative: 33 %
Lymphs Abs: 2.8 10*3/uL (ref 0.7–4.0)
MCH: 28.8 pg (ref 26.0–34.0)
MCHC: 32.6 g/dL (ref 30.0–36.0)
MCV: 88.4 fL (ref 80.0–100.0)
Monocytes Absolute: 0.6 10*3/uL (ref 0.1–1.0)
Monocytes Relative: 7 %
Neutro Abs: 5.1 10*3/uL (ref 1.7–7.7)
Neutrophils Relative %: 58 %
Platelets: 296 10*3/uL (ref 150–400)
RBC: 4.23 MIL/uL (ref 3.87–5.11)
RDW: 12.8 % (ref 11.5–15.5)
WBC: 8.6 10*3/uL (ref 4.0–10.5)
nRBC: 0 % (ref 0.0–0.2)

## 2022-07-06 LAB — COMPREHENSIVE METABOLIC PANEL
ALT: 24 U/L (ref 0–44)
AST: 31 U/L (ref 15–41)
Albumin: 4.1 g/dL (ref 3.5–5.0)
Alkaline Phosphatase: 67 U/L (ref 38–126)
Anion gap: 9 (ref 5–15)
BUN: 12 mg/dL (ref 6–20)
CO2: 27 mmol/L (ref 22–32)
Calcium: 7.5 mg/dL — ABNORMAL LOW (ref 8.9–10.3)
Chloride: 100 mmol/L (ref 98–111)
Creatinine, Ser: 0.97 mg/dL (ref 0.44–1.00)
GFR, Estimated: >60 mL/min (ref >60)
Glucose, Bld: 209 mg/dL — ABNORMAL HIGH (ref 70–99)
Potassium: 2.8 mmol/L — ABNORMAL LOW (ref 3.5–5.1)
Sodium: 136 mmol/L (ref 135–145)
Total Bilirubin: 0.6 mg/dL (ref 0.3–1.2)
Total Protein: 7.9 g/dL (ref 6.5–8.1)

## 2022-07-06 LAB — TROPONIN I (HIGH SENSITIVITY): Troponin I (High Sensitivity): <2 ng/L (ref <18)

## 2022-07-06 MED ORDER — SODIUM CHLORIDE 0.9 % IV SOLN: INTRAVENOUS | Status: DC | PRN

## 2022-07-06 MED ORDER — POTASSIUM CHLORIDE 10 MEQ/100ML IV SOLN
10.0000 meq | Freq: Once | INTRAVENOUS | Status: AC
  Administered 2022-07-06: 10 meq via INTRAVENOUS
  Filled 2022-07-06: qty 100

## 2022-07-06 MED ORDER — POTASSIUM CHLORIDE CRYS ER 20 MEQ PO TBCR: 40.0000 meq | EXTENDED_RELEASE_TABLET | Freq: Two times a day (BID) | ORAL | 0 refills | Status: DC

## 2022-07-06 MED ORDER — POTASSIUM CHLORIDE CRYS ER 20 MEQ PO TBCR: 40.0000 meq | EXTENDED_RELEASE_TABLET | Freq: Two times a day (BID) | ORAL | 0 refills | Status: AC

## 2022-07-06 MED ORDER — POTASSIUM CHLORIDE CRYS ER 20 MEQ PO TBCR
40.0000 meq | EXTENDED_RELEASE_TABLET | Freq: Once | ORAL | Status: AC
  Administered 2022-07-06: 40 meq via ORAL
  Filled 2022-07-06: qty 2

## 2022-07-06 NOTE — ED Provider Notes (Signed)
[facility] DEPARTMENT AT [facility] Provider Note   CSN: [id number] Arrival date & time: 07/06/22  1718     History  Chief Complaint  Patient presents with   Numbness   Chest Pain    Kelli Carroll is a 47 y.o. female.  Patient here with some paresthesias throughout her body.  Denies any chest pain or headache or weakness.  History of hypertension, anxiety, depression, migraines.  Does not have any current headaches.  Denies any shortness of breath or chest pain actively.  She thought she was having some chest discomfort earlier today as well.  Denies any shortness of breath.  The history is provided by the patient.       Home Medications Prior to Admission medications   Medication Sig Start Date End Date Taking? Authorizing Provider  acetaminophen (TYLENOL) 500 MG tablet Take 1,000 mg by mouth every 6 (six) hours as needed for moderate pain.    [provider]  amLODipine (NORVASC) 10 MG tablet Take 10 mg by mouth daily.     [provider]  cetirizine (ZYRTEC) 10 MG tablet Take 10 mg by mouth at bedtime.    [provider]  chlorthalidone (HYGROTON) 25 MG tablet Take 25 mg by mouth daily.    [provider]  Cholecalciferol (VITAMIN D) 50 MCG (2000 UT) CAPS Take 2,000 Units by mouth daily.    [provider]  dicyclomine (BENTYL) 10 MG capsule Take 10 mg by mouth 2 (two) times daily.    [provider]  esomeprazole (NEXIUM) 40 MG capsule Take 40 mg by mouth daily. 05/10/18   [provider]  guaiFENesin-codeine 100-10 MG/5ML syrup Take 5 mLs by mouth 3 (three) times daily as needed for cough. Patient not taking: Reported on 08/25/2018 03/14/13   Roxy Horseman, PA-C  ibuprofen (ADVIL) 200 MG tablet Take 400 mg by mouth every 6 (six) hours as needed for moderate pain.    [provider]  naproxen (NAPROSYN) 500 MG tablet Take 1 tablet (500 mg total) by mouth 2 (two) times  daily. Patient not taking: Reported on 08/25/2018 06/13/17   Emi Holes, PA-C  ondansetron (ZOFRAN ODT) 4 MG disintegrating tablet Take 1 tablet (4 mg total) by mouth every 8 (eight) hours as needed for nausea or vomiting. 05/27/20   Placido Sou, PA-C  potassium chloride (K-DUR) 10 MEQ tablet Take 1 tablet (10 mEq total) by mouth daily. Patient not taking: Reported on 08/25/2018 06/13/17   Emi Holes, PA-C  potassium chloride SA (KLOR-CON M) 20 MEQ tablet Take 2 tablets (40 mEq total) by mouth 2 (two) times daily. 07/06/22   Malaki Koury, DO  traMADol (ULTRAM) 50 MG tablet Take 1-2 tablets (50-100 mg total) by mouth every 6 (six) hours as needed. 09/02/18   Darnell Level, MD  venlafaxine XR (EFFEXOR-XR) 37.5 MG 24 hr capsule Take 37.5 mg by mouth daily with breakfast.    [provider]      Allergies    Patient has no known allergies.    Review of Systems   Review of Systems  Physical Exam Updated Vital Signs BP 134/85 (BP Location: Right Arm)   Pulse 88   Temp 98.6 F (37 C) (Oral)   Resp 20   Ht 5\' 2"  (1.575 m)   Wt 129.9 kg   SpO2 97%   BMI 52.38 kg/m  Physical Exam Vitals and nursing note reviewed.  Constitutional:      General: She  is not in acute distress.    Appearance: She is well-developed. She is not ill-appearing.  HENT:     Head: Normocephalic and atraumatic.  Eyes:     Extraocular Movements: Extraocular movements intact.     Conjunctiva/sclera: Conjunctivae normal.     Pupils: Pupils are equal, round, and reactive to light.  Cardiovascular:     Rate and Rhythm: Normal rate and regular rhythm.     Pulses:          Radial pulses are 2+ on the right side and 2+ on the left side.     Heart sounds: Normal heart sounds. No murmur heard. Pulmonary:     Effort: Pulmonary effort is normal. No respiratory distress.     Breath sounds: Normal breath sounds.  Abdominal:     Palpations: Abdomen is soft.     Tenderness: There is no abdominal  tenderness.  Musculoskeletal:        General: No swelling.     Cervical back: Normal range of motion and neck supple.  Skin:    General: Skin is warm and dry.     Capillary Refill: Capillary refill takes less than 2 seconds.  Neurological:     Mental Status: She is alert.     Comments: 5+ out of 5 strength throughout, normal sensation, no drift, normal finger-nose-finger, normal speech, normal visual fields  Psychiatric:        Mood and Affect: Mood normal.     ED Results / Procedures / Treatments   Labs (all labs ordered are listed, but only abnormal results are displayed) Labs Reviewed  COMPREHENSIVE METABOLIC PANEL - Abnormal; Notable for the following components:      Result Value   Potassium 2.8 (*)    Glucose, Bld 209 (*)    Calcium 7.5 (*)    All other components within normal limits  CBC WITH DIFFERENTIAL/PLATELET  TROPONIN I (HIGH SENSITIVITY)    EKG EKG Interpretation  Date/Time:  Monday July 06 2022 17:47:29 EDT Ventricular Rate:  92 PR Interval:  160 QRS Duration: 81 QT Interval:  357 QTC Calculation: 442 R Axis:   25 Text Interpretation: Sinus rhythm Low voltage, precordial leads Abnormal R-wave progression, early transition Borderline repolarization abnormality Confirmed by Virgina Norfolk ([id number]) on 07/06/2022 5:50:08 PM  Radiology DG Chest Portable 1 View  Result Date: 07/06/2022 CLINICAL DATA:  Chest pain EXAM: PORTABLE CHEST 1 VIEW COMPARISON:  X-ray 06/13/2017 FINDINGS: No consolidation, pneumothorax or effusion. No edema. Normal cardiopericardial silhouette. IMPRESSION: No acute cardiopulmonary disease. Electronically Signed   By: Karen Kays M.D.   On: 07/06/2022 19:15    Procedures Procedures    Medications Ordered in ED Medications  0.9 %  sodium chloride infusion ( Intravenous Stopped 07/06/22 2021)  potassium chloride 10 mEq in 100 mL IVPB (0 mEq Intravenous Stopped 07/06/22 2017)  potassium chloride SA (KLOR-CON M) CR tablet 40 mEq (40 mEq  Oral Given 07/06/22 1916)    ED Course/ Medical Decision Making/ A&P                             Medical Decision Making Amount and/or Complexity of Data Reviewed Labs: ordered. Radiology: ordered.  Risk Prescription drug management.   Kelli Carroll is here with chest pain and tingling.  Normal vitals with no fever.  Differential diagnosis seems less likely to be ACS or PE.  Have no concern for stroke.  Could be less related issues.  She not having headache.  She appears very well.  She has electrolyte issues in the past.  EKG shows sinus rhythm.  No ischemic changes.  Potassium was 2.8 and repleted.  I suspect these are the cause of her paresthesias.  Troponin is normal.  Chest x-ray with no pneumonia or pneumothorax.  I reviewed interpreted labs and overall unremarkable otherwise.  She is feeling better.  Have no concern for cardiac process or pulmonary process.  Will prescribe potassium for outpatient repletion.  Discharged in good condition.  Logical exam was normal.  This chart was dictated using voice recognition software.  Despite best efforts to proofread,  errors can occur which can change the documentation meaning.         Final Clinical Impression(s) / ED Diagnoses Final diagnoses:  Paresthesias  Hypokalemia    Rx / DC Orders ED Discharge Orders          Ordered    potassium chloride SA (KLOR-CON M) 20 MEQ tablet  2 times daily,   Status:  Discontinued        07/06/22 2024    potassium chloride SA (KLOR-CON M) 20 MEQ tablet  2 times daily        07/06/22 2024              Virgina Norfolk, DO 07/06/22 2026

## 2022-07-06 NOTE — ED Triage Notes (Signed)
Pt c/o numbness in bilateral arms and face since yesterday (approximately 1500), along with chest tightness.  Denies cough, SHOB, N/V/D.  Equal sensation in bilateral arms, along with strength.  Pt describes face as similar to if she "sat on her legs for a long time".

## 2022-09-19 ENCOUNTER — Encounter (HOSPITAL_BASED_OUTPATIENT_CLINIC_OR_DEPARTMENT_OTHER): Payer: Self-pay

## 2022-09-19 ENCOUNTER — Emergency Department (HOSPITAL_BASED_OUTPATIENT_CLINIC_OR_DEPARTMENT_OTHER): Payer: Medicaid Other

## 2022-09-19 ENCOUNTER — Emergency Department (HOSPITAL_BASED_OUTPATIENT_CLINIC_OR_DEPARTMENT_OTHER)
Admission: EM | Admit: 2022-09-19 | Discharge: 2022-09-19 | Disposition: A | Discharge status: Home / Self Care | Payer: Medicaid Other | Attending: Emergency Medicine | Admitting: Emergency Medicine

## 2022-09-19 DIAGNOSIS — E876 Hypokalemia: Secondary | ICD-10-CM | POA: Diagnosis not present

## 2022-09-19 DIAGNOSIS — L03213 Periorbital cellulitis: Secondary | ICD-10-CM | POA: Diagnosis not present

## 2022-09-19 DIAGNOSIS — Z79899 Other long term (current) drug therapy: Secondary | ICD-10-CM | POA: Diagnosis not present

## 2022-09-19 DIAGNOSIS — E119 Type 2 diabetes mellitus without complications: Secondary | ICD-10-CM | POA: Insufficient documentation

## 2022-09-19 DIAGNOSIS — R220 Localized swelling, mass and lump, head: Secondary | ICD-10-CM | POA: Diagnosis present

## 2022-09-19 DIAGNOSIS — I10 Essential (primary) hypertension: Secondary | ICD-10-CM | POA: Diagnosis not present

## 2022-09-19 LAB — CBC WITH DIFFERENTIAL/PLATELET
Abs Immature Granulocytes: 0.02 10*3/uL (ref 0.00–0.07)
Basophils Absolute: 0 10*3/uL (ref 0.0–0.1)
Basophils Relative: 0 %
Eosinophils Absolute: 0.1 10*3/uL (ref 0.0–0.5)
Eosinophils Relative: 1 %
HCT: 37.1 % (ref 36.0–46.0)
Hemoglobin: 12 g/dL (ref 12.0–15.0)
Immature Granulocytes: 0 %
Lymphocytes Relative: 29 %
Lymphs Abs: 2.2 10*3/uL (ref 0.7–4.0)
MCH: 28.7 pg (ref 26.0–34.0)
MCHC: 32.3 g/dL (ref 30.0–36.0)
MCV: 88.8 fL (ref 80.0–100.0)
Monocytes Absolute: 0.6 10*3/uL (ref 0.1–1.0)
Monocytes Relative: 8 %
Neutro Abs: 4.7 10*3/uL (ref 1.7–7.7)
Neutrophils Relative %: 62 %
Platelets: 306 10*3/uL (ref 150–400)
RBC: 4.18 MIL/uL (ref 3.87–5.11)
RDW: 12.7 % (ref 11.5–15.5)
WBC: 7.5 10*3/uL (ref 4.0–10.5)
nRBC: 0 % (ref 0.0–0.2)

## 2022-09-19 LAB — BASIC METABOLIC PANEL
Anion gap: 9 (ref 5–15)
BUN: 11 mg/dL (ref 6–20)
CO2: 26 mmol/L (ref 22–32)
Calcium: 8.9 mg/dL (ref 8.9–10.3)
Chloride: 100 mmol/L (ref 98–111)
Creatinine, Ser: 0.8 mg/dL (ref 0.44–1.00)
GFR, Estimated: >60 mL/min (ref >60)
Glucose, Bld: 118 mg/dL — ABNORMAL HIGH (ref 70–99)
Potassium: 3.3 mmol/L — ABNORMAL LOW (ref 3.5–5.1)
Sodium: 135 mmol/L (ref 135–145)

## 2022-09-19 LAB — PREGNANCY, URINE: Preg Test, Ur: NEGATIVE

## 2022-09-19 MED ORDER — IOHEXOL 300 MG/ML  SOLN
75.0000 mL | Freq: Once | INTRAMUSCULAR | Status: AC | PRN
  Administered 2022-09-19: 75 mL via INTRAVENOUS

## 2022-09-19 MED ORDER — AMOXICILLIN-POT CLAVULANATE 875-125 MG PO TABS: 1.0000 | ORAL_TABLET | Freq: Two times a day (BID) | ORAL | 0 refills | Status: AC

## 2022-09-19 MED ORDER — POTASSIUM CHLORIDE CRYS ER 20 MEQ PO TBCR
40.0000 meq | EXTENDED_RELEASE_TABLET | Freq: Once | ORAL | Status: AC
  Administered 2022-09-19: 40 meq via ORAL
  Filled 2022-09-19: qty 2

## 2022-09-19 MED ORDER — SULFAMETHOXAZOLE-TRIMETHOPRIM 800-160 MG PO TABS: 1.0000 | ORAL_TABLET | Freq: Two times a day (BID) | ORAL | 0 refills | Status: AC

## 2022-09-19 NOTE — Discharge Instructions (Addendum)
Use warm compress 3x daily as needed   Take antibiotics as prescribed   If the swelling becomes worse, you have pain to your eye or vision changes please come back for reevaluation   It was a pleasure caring for you today in the emergency department.   Please return to the emergency department for any worsening or worrisome symptoms.

## 2022-09-19 NOTE — ED Triage Notes (Signed)
Pt reports left eye swelling x 4 days. Thought intially stye, but has continued to swell

## 2022-09-19 NOTE — ED Provider Notes (Signed)
[place] [facility] AT [facility] Provider Note  CSN: [id number] Arrival date & time: 09/19/22 1050  Chief Complaint(s) Facial Swelling  HPI Kelli Carroll is a 47 y.o. female with past medical history as below, significant for diabetes, anxiety, depression, GERD, hypertension who presents to the ED with complaint of left eyelid swelling  Patient with left side eyelid swelling over the past 4 days.  She has been using warm compresses, cool compresses and topical ointment provided by pharmacy over the past few days without much relief of her symptoms.  Swelling has gotten worse.  She has mild discomfort when moving her eye but no diplopia or significant visual changes.  No fevers, no difficulty breathing or swallowing, no ear pain.  Does not use contacts or eyeglasses daily  Past Medical History Past Medical History:  Diagnosis Date   Anxiety    Depression    GERD (gastroesophageal reflux disease)    Heart murmur    "i was told i have one but ive always been told that "    Hypertension    Kidney stone    every now and then i catch some pain from it and they said i have a little blood in my urine    Migraines    " every now and then but none in a while"    Neoplasm of uncertain behavior of thyroid gland    Prediabetes    cntorlling with diet    Sleep apnea    severe sleep apnea , waiting on insurance to approve supplies and device     Patient Active Problem List   Diagnosis Date Noted   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 Authorizing Provider  amoxicillin-clavulanate (AUGMENTIN) 875-125 MG tablet Take 1 tablet by mouth every 12 (twelve) hours. 09/19/22  Yes Tanda Rockers A, DO  sulfamethoxazole-trimethoprim (BACTRIM DS) 800-160 MG tablet Take 1 tablet by mouth 2 (two) times daily for 7 days. 09/19/22 09/26/22 Yes Sloan Leiter, DO   acetaminophen (TYLENOL) 500 MG tablet Take 1,000 mg by mouth every 6 (six) hours as needed for moderate pain.    [provider]  amLODipine (NORVASC) 10 MG tablet Take 10 mg by mouth daily.     [provider]  cetirizine (ZYRTEC) 10 MG tablet Take 10 mg by mouth at bedtime.    [provider]  chlorthalidone (HYGROTON) 25 MG tablet Take 25 mg by mouth daily.    [provider]  Cholecalciferol (VITAMIN D) 50 MCG (2000 UT) CAPS Take 2,000 Units by mouth daily.    [provider]  dicyclomine (BENTYL) 10 MG capsule Take 10 mg by mouth 2 (two) times daily.    [provider]  esomeprazole (NEXIUM) 40 MG capsule Take 40 mg by mouth daily. 05/10/18   [provider]  guaiFENesin-codeine 100-10 MG/5ML syrup Take 5 mLs by mouth 3 (three) times daily as needed for cough. Patient not taking: Reported on 08/25/2018 03/14/13   Roxy Horseman, PA-C  ibuprofen (ADVIL) 200 MG tablet Take 400 mg by mouth every 6 (six) hours as needed for moderate pain.    [provider]  naproxen (NAPROSYN) 500 MG tablet Take 1 tablet (500 mg total) by mouth 2 (two) times daily. Patient not taking: Reported on 08/25/2018 06/13/17   Emi Holes, PA-C  ondansetron (ZOFRAN ODT) 4 MG disintegrating tablet Take 1  tablet (4 mg total) by mouth every 8 (eight) hours as needed for nausea or vomiting. 05/27/20   Placido Sou, PA-C  potassium chloride (K-DUR) 10 MEQ tablet Take 1 tablet (10 mEq total) by mouth daily. Patient not taking: Reported on 08/25/2018 06/13/17   Emi Holes, PA-C  potassium chloride SA (KLOR-CON M) 20 MEQ tablet Take 2 tablets (40 mEq total) by mouth 2 (two) times daily. 07/06/22   Curatolo, Adam, DO  traMADol (ULTRAM) 50 MG tablet Take 1-2 tablets (50-100 mg total) by mouth every 6 (six) hours as needed. 09/02/18   Darnell Level, MD  venlafaxine XR (EFFEXOR-XR) 37.5 MG 24 hr capsule Take 37.5 mg by mouth daily with breakfast.    [provider]                                                                                                                                    Past Surgical History Past Surgical History:  Procedure Laterality Date   DILATION AND CURETTAGE OF UTERUS     THYROID LOBECTOMY Right 09/01/2018   Procedure: RIGHT THYROID LOBECTOMY;  Surgeon: Darnell Level, MD;  Location: [facility];  Service: General;  Laterality: Right;   TUBAL LIGATION      ~2010 done when she lived in [place]    TUBAL LIGATION     WISDOM TOOTH EXTRACTION     Family History Family History  Problem Relation Age of Onset   Breast cancer Maternal Aunt     Social History Social History   Tobacco Use   Smoking status: Never   Smokeless tobacco: Never  Substance Use Topics   Alcohol use: Yes   Drug use: No   Allergies Patient has no known allergies.  Review of Systems Review of Systems  Constitutional:  Negative for chills and fever.  HENT:  Positive for facial swelling.   Eyes:  Positive for pain.  Respiratory:  Negative for chest tightness and shortness of breath.   Cardiovascular:  Negative for chest pain and palpitations.  Gastrointestinal:  Negative for abdominal pain and vomiting.  Musculoskeletal:  Negative for neck stiffness.  Skin:  Positive for rash.  Neurological:  Negative for headaches.  All other systems reviewed and are negative.   Physical Exam Vital Signs  I have reviewed the triage vital signs BP (!) 149/96   Pulse 79   Temp 98 F (36.7 C)   Resp 18   SpO2 98%  Physical Exam Vitals and nursing note reviewed.  Constitutional:      General: She is not in acute distress.    Appearance: Normal appearance. She is well-developed. She is obese. She is not ill-appearing.  HENT:     Head: Normocephalic and atraumatic.     Right Ear: External ear normal.     Left Ear: External ear normal.     Nose: Nose normal.     Mouth/Throat:  Mouth: Mucous membranes are moist.  Eyes:     General:  Vision grossly intact. Gaze aligned appropriately. No scleral icterus.       Right eye: No discharge.        Left eye: No discharge.     Extraocular Movements: Extraocular movements intact.     Right eye: Normal extraocular motion.     Left eye: Normal extraocular motion.     Conjunctiva/sclera: Conjunctivae normal.     Right eye: Right conjunctiva is not injected.     Left eye: Left conjunctiva is not injected.   Cardiovascular:     Rate and Rhythm: Normal rate.  Pulmonary:     Effort: Pulmonary effort is normal. No respiratory distress.     Breath sounds: No stridor.  Abdominal:     General: Abdomen is flat. There is no distension.     Tenderness: There is no guarding.  Musculoskeletal:        General: No deformity.     Cervical back: No rigidity.  Skin:    General: Skin is warm and dry.     Coloration: Skin is not cyanotic, jaundiced or pale.  Neurological:     Mental Status: She is alert and oriented to person, place, and time.     GCS: GCS eye subscore is 4. GCS verbal subscore is 5. GCS motor subscore is 6.  Psychiatric:        Speech: Speech normal.        Behavior: Behavior normal. Behavior is cooperative.     ED Results and Treatments Labs (all labs ordered are listed, but only abnormal results are displayed) Labs Reviewed  BASIC METABOLIC PANEL - Abnormal; Notable for the following components:      Result Value   Potassium 3.3 (*)    Glucose, Bld 118 (*)    All other components within normal limits  CBC WITH DIFFERENTIAL/PLATELET  PREGNANCY, URINE                                                                                                                          Radiology CT Orbits W Contrast  Result Date: 09/19/2022 CLINICAL DATA:  47 year old female with left eye swelling for 4 days. EXAM: CT ORBITS WITH CONTRAST TECHNIQUE: Multidetector CT images was performed according to the standard protocol following intravenous contrast administration. RADIATION  DOSE REDUCTION: This exam was performed according to the departmental dose-optimization program which includes automated exposure control, adjustment of the mA and/or kV according to patient size and/or use of iterative reconstruction technique. CONTRAST:  75mL OMNIPAQUE IOHEXOL 300 MG/ML  SOLN COMPARISON:  None Available. FINDINGS: Orbits: Intact orbital walls. Globes are intact. Right orbits soft tissues, and postseptal left orbit soft tissues appear normal. Left orbit preseptal soft tissue swelling (series 2, image 18) with no soft tissue gas or fluid collection. The left premalar space is relatively spared. And otherwise the periorbital soft tissues appear negative. Visible paranasal sinuses: Visualized paranasal sinuses are  clear. Soft tissues: Visible deep soft tissue spaces of the face appear negative. Osseous: No osseous abnormality identified. Limited intracranial: Partially empty sella, otherwise negative visible brain parenchyma. IMPRESSION: Soft tissue swelling compatible with preseptal cellulitis at the Left orbit. No postseptal involvement, abscess, or other complicating features. Electronically Signed   By: Odessa Fleming M.D.   On: 09/19/2022 13:06    Pertinent labs & imaging results that were available during my care of the patient were reviewed by me and considered in my medical decision making (see MDM for details).  Medications Ordered in ED Medications  potassium chloride SA (KLOR-CON M) CR tablet 40 mEq (40 mEq Oral Given 09/19/22 1246)  iohexol (OMNIPAQUE) 300 MG/ML solution 75 mL (75 mLs Intravenous Contrast Given 09/19/22 1229)                                                                                                                                     Procedures Procedures  (including critical care time)  Medical Decision Making / ED Course    Medical Decision Making:    Chantal Hatch is a 47 y.o. female with past medical history as below, significant for diabetes,  anxiety, depression, GERD, hypertension who presents to the ED with complaint of left eyelid swelling. The complaint involves an extensive differential diagnosis and also carries with it a high risk of complications and morbidity.  Serious etiology was considered. Ddx includes but is not limited to: Preseptal cellulitis, orbital cellulitis, blepharitis, chalazion globe trauma, etc.  Complete initial physical exam performed, notably the patient  was no acute distress, vision grossly intact.    Reviewed and confirmed nursing documentation for past medical history, family history, social history.  Vital signs reviewed.    Clinical Course as of 09/19/22 1329  Sat Sep 19, 2022  1143 Potassium mildly low - okay; labs o/w stable [SG]  1310 CT with pre-septal cellulitis, no post-septal changes  [SG]    Clinical Course User Index [SG] Sloan Leiter, DO     Patient with some mild discomfort with eye movement, no significant vision changes.  EOMI.  Will get CT orbit to rule out orbital cellulitis  Labs stable, CT with preseptal cellulitis.  Will start oral antibiotics, have patient follow-up with PCP, have him see ophthalmology if symptoms persist or do not improve.  Visual acuity stable, conjunctiva stable  The patient improved significantly and was discharged in stable condition. Detailed discussions were had with the patient regarding current findings, and need for close f/u with PCP or on call doctor. The patient has been instructed to return immediately if the symptoms worsen in any way for re-evaluation. Patient verbalized understanding and is in agreement with current care plan. All questions answered prior to discharge.               Additional history obtained: -Additional history obtained from na -External records from outside source obtained and  reviewed including: Chart review including previous notes, labs, imaging, consultation notes including  Home medications Prior ED  visits   Lab Tests: -I ordered, reviewed, and interpreted labs.   The pertinent results include:   Labs Reviewed  BASIC METABOLIC PANEL - Abnormal; Notable for the following components:      Result Value   Potassium 3.3 (*)    Glucose, Bld 118 (*)    All other components within normal limits  CBC WITH DIFFERENTIAL/PLATELET  PREGNANCY, URINE    Notable for mild hypokalemia   EKG   EKG Interpretation Date/Time:    Ventricular Rate:    PR Interval:    QRS Duration:    QT Interval:    QTC Calculation:   R Axis:      Text Interpretation:           Imaging Studies ordered: I ordered imaging studies including CT orbit I independently visualized the following imaging with scope of interpretation limited to determining acute life threatening conditions related to emergency care; findings noted above, significant for pre-septal cellulitis I independently visualized and interpreted imaging. I agree with the radiologist interpretation   Medicines ordered and prescription drug management: Meds ordered this encounter  Medications   potassium chloride SA (KLOR-CON M) CR tablet 40 mEq   iohexol (OMNIPAQUE) 300 MG/ML solution 75 mL   amoxicillin-clavulanate (AUGMENTIN) 875-125 MG tablet    Sig: Take 1 tablet by mouth every 12 (twelve) hours.    Dispense:  14 tablet    Refill:  0   sulfamethoxazole-trimethoprim (BACTRIM DS) 800-160 MG tablet    Sig: Take 1 tablet by mouth 2 (two) times daily for 7 days.    Dispense:  14 tablet    Refill:  0    -I have reviewed the patients home medicines and have made adjustments as needed   Consultations Obtained: na   Cardiac Monitoring: Continuous pulse oximetry interpreted by myself, 99% on RA.    Social Determinants of Health:  Diagnosis or treatment significantly limited by social determinants of health: obesity   Reevaluation: After the interventions noted above, I reevaluated the patient and found that they have  improved  Co morbidities that complicate the patient evaluation  Past Medical History:  Diagnosis Date   Anxiety    Depression    GERD (gastroesophageal reflux disease)    Heart murmur    "i was told i have one but ive always been told that "    Hypertension    Kidney stone    every now and then i catch some pain from it and they said i have a little blood in my urine    Migraines    " every now and then but none in a while"    Neoplasm of uncertain behavior of thyroid gland    Prediabetes    cntorlling with diet    Sleep apnea    severe sleep apnea , waiting on insurance to approve supplies and device        Dispostion: Disposition decision including need for hospitalization was considered, and patient discharged from emergency department.    Final Clinical Impression(s) / ED Diagnoses Final diagnoses:  Hypokalemia  Preseptal cellulitis        Sloan Leiter, DO 09/19/22 1329

## 2022-09-24 ENCOUNTER — Telehealth (HOSPITAL_BASED_OUTPATIENT_CLINIC_OR_DEPARTMENT_OTHER): Payer: Self-pay | Admitting: Emergency Medicine

## 2022-09-24 MED ORDER — FLUCONAZOLE 200 MG PO TABS: 200.0000 mg | ORAL_TABLET | Freq: Every day | ORAL | 0 refills | Status: DC

## 2022-09-24 NOTE — Telephone Encounter (Signed)
Called in med for yeast infection.

## 2022-09-25 ENCOUNTER — Telehealth (HOSPITAL_BASED_OUTPATIENT_CLINIC_OR_DEPARTMENT_OTHER): Payer: Self-pay | Admitting: Emergency Medicine

## 2022-09-25 MED ORDER — FLUCONAZOLE 200 MG PO TABS: 200.0000 mg | ORAL_TABLET | Freq: Every day | ORAL | 0 refills | Status: AC

## 2022-09-25 NOTE — Telephone Encounter (Signed)
Patient calling requesting diflucan for yeast infection for antibiotics prescribed yesterday.   Will send prescription to pharmacy

## 2022-10-19 NOTE — Progress Notes (Signed)
I reached out to the pt this morning to make self introduction and explain that her referral will remain in our queue until after she sees pulmonology as her PCM also put in a referral for them as well. Pt verbalized understanding.

## 2022-11-19 ENCOUNTER — Telehealth: Payer: Self-pay | Admitting: Emergency Medicine

## 2022-11-19 NOTE — Telephone Encounter (Signed)
PT scan wont be able to bring in her Scans due to it going through Medical Records first and then being released to [place]

## 2022-11-20 ENCOUNTER — Ambulatory Visit: Payer: Medicaid Other | Admitting: Emergency Medicine

## 2022-11-20 ENCOUNTER — Encounter: Payer: Self-pay | Admitting: Emergency Medicine

## 2022-11-20 VITALS — BP 148/90 | HR 83 | Ht 62.0 in | Wt 292.8 lb

## 2022-11-20 DIAGNOSIS — R911 Solitary pulmonary nodule: Secondary | ICD-10-CM | POA: Diagnosis not present

## 2022-11-20 NOTE — Assessment & Plan Note (Signed)
2.5 mm lower lobe pulmonary nodule spuriously found on a CT scan of the abdomen.  Likely benign.  I discussed the differential diagnosis with her and explained that sometimes nodules are clinically relevant, either inflammatory infectious or possibly even malignant.  She is low risk for malignancy.  I have recommended a dedicated CT scan of the chest to ensure there are no other nodules that would merit follow-up.  If none are greater than 4-6 mm then I do not think any further scanning will be necessary.  If she does have nodules that need to be followed that involve then we will plan appropriate diagnostic testing

## 2022-11-20 NOTE — Patient Instructions (Signed)
We reviewed the report from your CT scan of the abdomen done in September We will plan to perform a dedicated CT scan of the chest Continue CPAP every night as you have been doing Follow Dr. Delton Coombes next available after your CT chest so we can review those results together

## 2022-11-20 NOTE — Progress Notes (Signed)
Subjective:    Patient ID: Kelli Carroll, female    DOB: 06/02/1975, 47 y.o.   MRN: [id number]  HPI 47 year old woman, never smoker with history of hypertension, GERD, anxiety/depression, migraines, OSA on CPAP.  She had an ER visit with left sided eye swelling, underwent a CT scan of her orbits 09/19/2022 that was consistent with preseptal cellulitis of the left orbit without any abscess or other complicating features. She underwent a CT urogram on 10/02/22 to evaluate hematuria that identified a 2.5 mm right lower lobe pulmonary nodule.  She is here to discuss that scan. She is feeling well. No respiratory sx. No cough. Some exertional SOB, notices w stairs. No rashes.    Review of Systems As per HPI  Past Medical History:  Diagnosis Date   Anxiety    Depression    GERD (gastroesophageal reflux disease)    Heart murmur    "i was told i have one but ive always been told that "    Hypertension    Kidney stone    every now and then i catch some pain from it and they said i have a little blood in my urine    Migraines    " every now and then but none in a while"    Neoplasm of uncertain behavior of thyroid gland    Prediabetes    cntorlling with diet    Sleep apnea    severe sleep apnea , waiting on insurance to approve supplies and device       Family History  Problem Relation Age of Onset   Asthma Mother    Breast cancer Maternal Aunt      Social History   Socioeconomic History   Marital status: Single    Spouse name: Not on file   Number of children: Not on file   Years of education: Not on file   Highest education level: Not on file  Occupational History   Not on file  Tobacco Use   Smoking status: Never    Passive exposure: Past   Smokeless tobacco: Never  Vaping Use   Vaping status: Never Used  Substance and Sexual Activity   Alcohol use: Yes   Drug use: No   Sexual activity: Not on file  Other Topics Concern   Not on file  Social History Narrative    Not on file   Social Determinants of Health   Financial Resource Strain: Not on file  Food Insecurity: Not on file  Transportation Needs: Not on file  Physical Activity: Not on file  Stress: Not on file  Social Connections: Not on file  Intimate Partner Violence: Not on file    From [place], [place], [place] Has worked [employer], 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    No Known Allergies   Outpatient Medications Prior to Visit  Medication Sig Dispense Refill   acetaminophen (TYLENOL) 500 MG tablet Take 1,000 mg by mouth every 6 (six) hours as needed for moderate pain.     esomeprazole (NEXIUM) 40 MG capsule Take 40 mg by mouth daily.     amoxicillin-clavulanate (AUGMENTIN) 875-125 MG tablet Take 1 tablet by mouth every 12 (twelve) hours. 14 tablet 0   cetirizine (ZYRTEC) 10 MG tablet Take 10 mg by mouth at bedtime.     chlorthalidone (HYGROTON) 25 MG tablet Take 25 mg by mouth daily.     Cholecalciferol (VITAMIN D) 50 MCG (2000 UT) CAPS Take 2,000 Units by mouth daily.  dicyclomine (BENTYL) 10 MG capsule Take 10 mg by mouth 2 (two) times daily.     guaiFENesin-codeine 100-10 MG/5ML syrup Take 5 mLs by mouth 3 (three) times daily as needed for cough. (Patient not taking: Reported on 08/25/2018) 120 mL 0   ibuprofen (ADVIL) 200 MG tablet Take 400 mg by mouth every 6 (six) hours as needed for moderate pain.     naproxen (NAPROSYN) 500 MG tablet Take 1 tablet (500 mg total) by mouth 2 (two) times daily. (Patient not taking: Reported on 08/25/2018) 30 tablet 0   ondansetron (ZOFRAN ODT) 4 MG disintegrating tablet Take 1 tablet (4 mg total) by mouth every 8 (eight) hours as needed for nausea or vomiting. 10 tablet 0   potassium chloride (K-DUR) 10 MEQ tablet Take 1 tablet (10 mEq total) by mouth daily. (Patient not taking: Reported on 08/25/2018) 20 tablet 0   potassium chloride SA (KLOR-CON M) 20 MEQ tablet Take 2 tablets (40 mEq total) by mouth 2 (two) times daily. 12 tablet 0    traMADol (ULTRAM) 50 MG tablet Take 1-2 tablets (50-100 mg total) by mouth every 6 (six) hours as needed. 15 tablet 0   venlafaxine XR (EFFEXOR-XR) 37.5 MG 24 hr capsule Take 37.5 mg by mouth daily with breakfast.     amLODipine (NORVASC) 10 MG tablet Take 10 mg by mouth daily.      No facility-administered medications prior to visit.        Objective:   Physical Exam Vitals:   11/20/22 0920  BP: (!) 148/90  Pulse: 83  SpO2: 97%  Weight: 292 lb 12.8 oz (132.8 kg)  Height: 5\' 2"  (1.575 m)   Gen: Pleasant, overwt woman, in no distress,  normal affect  ENT: No lesions,  large tongue, mouth clear,  oropharynx clear, no postnasal drip  Neck: No JVD, no stridor  Lungs: No use of accessory muscles, no crackles or wheezing on normal respiration, no wheeze on forced expiration  Cardiovascular: RRR, heart sounds normal, no murmur or gallops, no peripheral edema  Musculoskeletal: No deformities, no cyanosis or clubbing  Neuro: alert, awake, non focal  Skin: Warm, no lesions or rash       Assessment & Plan:  Pulmonary nodule 2.5 mm lower lobe pulmonary nodule spuriously found on a CT scan of the abdomen.  Likely benign.  I discussed the differential diagnosis with her and explained that sometimes nodules are clinically relevant, either inflammatory infectious or possibly even malignant.  She is low risk for malignancy.  I have recommended a dedicated CT scan of the chest to ensure there are no other nodules that would merit follow-up.  If none are greater than 4-6 mm then I do not think any further scanning will be necessary.  If she does have nodules that need to be followed that involve then we will plan appropriate diagnostic testing   Levy Pupa, MD, PhD 11/20/2022, 10:09 AM [facility] [phone] or if no answer before 7:00PM call [phone] For any issues after 7:00PM please call eLink [phone]

## 2022-12-02 ENCOUNTER — Ambulatory Visit
Admission: RE | Admit: 2022-12-02 | Discharge: 2022-12-02 | Disposition: A | Discharge status: Home / Self Care | Payer: Medicaid Other | Source: Ambulatory Visit | Attending: Emergency Medicine | Admitting: Emergency Medicine

## 2022-12-02 DIAGNOSIS — R911 Solitary pulmonary nodule: Secondary | ICD-10-CM

## 2022-12-02 NOTE — Telephone Encounter (Signed)
Pt was seen in Office with Dr.Byrum. nfn closing encounter

## 2022-12-03 ENCOUNTER — Telehealth: Payer: Self-pay | Admitting: Emergency Medicine

## 2022-12-03 NOTE — Telephone Encounter (Signed)
DRI dropped off CT disk. Will be placed in Dr.Byrum's box

## 2022-12-28 ENCOUNTER — Telehealth: Payer: Self-pay | Admitting: Emergency Medicine

## 2022-12-28 NOTE — Telephone Encounter (Signed)
DRI disk will be placed in Kelli Carroll's box.

## 2023-01-28 ENCOUNTER — Encounter: Payer: Self-pay | Admitting: Emergency Medicine

## 2023-01-28 ENCOUNTER — Ambulatory Visit: Payer: Medicaid Other | Admitting: Emergency Medicine

## 2023-01-28 VITALS — BP 149/84 | HR 90 | Temp 98.3°F | Ht 62.0 in | Wt 292.6 lb

## 2023-01-28 DIAGNOSIS — R911 Solitary pulmonary nodule: Secondary | ICD-10-CM | POA: Diagnosis not present

## 2023-01-28 NOTE — Patient Instructions (Addendum)
 We reviewed your CT scan of the chest today.  There are no concerning pulmonary nodules.  This is good news.  There was some slight enlargement of axillary lymph nodes (in the armpit) of unclear significance.  Talk to your primary physician about this to decide whether you want to have a repeat CT scan to follow this. Follow with Dr. Shelah if needed for any changes in your respiratory symptoms.

## 2023-01-28 NOTE — Assessment & Plan Note (Signed)
 No concerning pulmonary nodule seen on her CT scan of the chest.  Reassured her about this.  She did have some axillary lymphadenopathy of unclear significance.  She is asymptomatic.  Unclear whether this needs to be followed but I did tell her that it would not be unreasonable to do so.  She is going to talk to her primary physician about this and have a repeat CT depending on their discussion.  She can follow-up with me as needed

## 2023-01-28 NOTE — Progress Notes (Signed)
 Subjective:    Patient ID: Kelli Carroll, female    DOB: 09/16/1975, 48 y.o.   MRN: [id number]  HPI 48 year old woman, never smoker with history of hypertension, GERD, anxiety/depression, migraines, OSA on CPAP.  She had an ER visit with left sided eye swelling, underwent a CT scan of her orbits 09/19/2022 that was consistent with preseptal cellulitis of the left orbit without any abscess or other complicating features. She underwent a CT urogram on 10/02/22 to evaluate hematuria that identified a 2.5 mm right lower lobe pulmonary nodule.  She is here to discuss that scan. She is feeling well. No respiratory sx. No cough. Some exertional SOB, notices w stairs. No rashes.   ROV 01/28/2023 --Kelli Carroll is 48, never smoker who follows up today to review his CT scan of the chest.  We performed this after a CT urogram 10/02/2022 identified a 2.5 mm right lower lobe pulmonary nodule.  She is doing well, asymptomatic with good functional capacity.  CT chest 12/02/2022 reviewed by me, shows small mediastinal nodes including a 9 mm short axis prevascular node and mildly enlarged bilateral axillary nodes up to 12-13 mm on the left.  There is a 3 mm calcified granuloma in the right lower lobe and some minimal left basilar scarring but no other suspicious nodules.   Review of Systems As per HPI  Past Medical History:  Diagnosis Date   Anxiety    Depression    GERD (gastroesophageal reflux disease)    Heart murmur    i was told i have one but ive always been told that     Hypertension    Kidney stone    every now and then i catch some pain from it and they said i have a little blood in my urine    Migraines     every now and then but none in a while    Neoplasm of uncertain behavior of thyroid  gland    Prediabetes    cntorlling with diet    Sleep apnea    severe sleep apnea , waiting on insurance to approve supplies and device       Family History  Problem Relation Age of Onset   Asthma  Mother    Breast cancer Maternal Aunt      Social History   Socioeconomic History   Marital status: Single    Spouse name: Not on file   Number of children: Not on file   Years of education: Not on file   Highest education level: Not on file  Occupational History   Not on file  Tobacco Use   Smoking status: Never    Passive exposure: Past   Smokeless tobacco: Never  Vaping Use   Vaping status: Never Used  Substance and Sexual Activity   Alcohol use: Yes   Drug use: No   Sexual activity: Not on file  Other Topics Concern   Not on file  Social History Narrative   Not on file   Social Drivers of Health   Financial Resource Strain: Not on file  Food Insecurity: Not on file  Transportation Needs: Not on file  Physical Activity: Not on file  Stress: Not on file  Social Connections: Not on file  Intimate Partner Violence: Not on file    From [place], [place], [place] Has worked [employer], 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    No Known Allergies   Outpatient Medications Prior to Visit  Medication Sig Dispense  Refill   acetaminophen  (TYLENOL ) 500 MG tablet Take 1,000 mg by mouth every 6 (six) hours as needed for moderate pain.     cetirizine (ZYRTEC) 10 MG tablet Take 10 mg by mouth at bedtime.     Cholecalciferol (VITAMIN D) 50 MCG (2000 UT) CAPS Take 2,000 Units by mouth daily.     esomeprazole (NEXIUM) 40 MG capsule Take 40 mg by mouth daily.     olmesartan (BENICAR) 20 MG tablet Take 20 mg by mouth daily.     omeprazole (PRILOSEC) 40 MG capsule Take 1 capsule by mouth daily.     triamterene-hydrochlorothiazide (DYAZIDE) 37.5-25 MG capsule Take 1 capsule by mouth daily.     amoxicillin -clavulanate (AUGMENTIN ) 875-125 MG tablet Take 1 tablet by mouth every 12 (twelve) hours. (Patient not taking: Reported on 01/28/2023) 14 tablet 0   chlorthalidone  (HYGROTON ) 25 MG tablet Take 25 mg by mouth daily. (Patient not taking: Reported on 01/28/2023)     dicyclomine  (BENTYL) 10 MG capsule Take 10 mg by mouth 2 (two) times daily. (Patient not taking: Reported on 01/28/2023)     guaiFENesin -codeine  100-10 MG/5ML syrup Take 5 mLs by mouth 3 (three) times daily as needed for cough. (Patient not taking: Reported on 01/28/2023) 120 mL 0   ibuprofen  (ADVIL ) 200 MG tablet Take 400 mg by mouth every 6 (six) hours as needed for moderate pain. (Patient not taking: Reported on 01/28/2023)     naproxen  (NAPROSYN ) 500 MG tablet Take 1 tablet (500 mg total) by mouth 2 (two) times daily. (Patient not taking: Reported on 01/28/2023) 30 tablet 0   ondansetron  (ZOFRAN  ODT) 4 MG disintegrating tablet Take 1 tablet (4 mg total) by mouth every 8 (eight) hours as needed for nausea or vomiting. (Patient not taking: Reported on 01/28/2023) 10 tablet 0   potassium chloride  (K-DUR) 10 MEQ tablet Take 1 tablet (10 mEq total) by mouth daily. (Patient not taking: Reported on 01/28/2023) 20 tablet 0   potassium chloride  SA (KLOR-CON  M) 20 MEQ tablet Take 2 tablets (40 mEq total) by mouth 2 (two) times daily. (Patient not taking: Reported on 01/28/2023) 12 tablet 0   traMADol  (ULTRAM ) 50 MG tablet Take 1-2 tablets (50-100 mg total) by mouth every 6 (six) hours as needed. (Patient not taking: Reported on 01/28/2023) 15 tablet 0   venlafaxine XR (EFFEXOR-XR) 37.5 MG 24 hr capsule Take 37.5 mg by mouth daily with breakfast. (Patient not taking: Reported on 01/28/2023)     No facility-administered medications prior to visit.        Objective:   Physical Exam Vitals:   01/28/23 0947  BP: (!) 149/84  Pulse: 90  Temp: 98.3 F (36.8 C)  TempSrc: Oral  SpO2: 95%  Weight: 292 lb 9.6 oz (132.7 kg)  Height: 5' 2 (1.575 m)   Gen: Pleasant, overwt woman, in no distress,  normal affect  ENT: No lesions,  large tongue, mouth clear,  oropharynx clear, no postnasal drip  Neck: No JVD, no stridor  Lungs: No use of accessory muscles, no crackles or wheezing on normal respiration, no wheeze on forced  expiration  Cardiovascular: RRR, heart sounds normal, no murmur or gallops, no peripheral edema  Musculoskeletal: No deformities, no cyanosis or clubbing  Neuro: alert, awake, non focal  Skin: Warm, no lesions or rash       Assessment & Plan:  Pulmonary nodule No concerning pulmonary nodule seen on her CT scan of the chest.  Reassured her about this.  She did  have some axillary lymphadenopathy of unclear significance.  She is asymptomatic.  Unclear whether this needs to be followed but I did tell her that it would not be unreasonable to do so.  She is going to talk to her primary physician about this and have a repeat CT depending on their discussion.  She can follow-up with me as needed    Lamar Chris, MD, PhD 01/28/2023, 10:05 AM [facility] [phone] or if no answer before 7:00PM call [phone] For any issues after 7:00PM please call eLink [phone]

## 2024-02-14 ENCOUNTER — Other Ambulatory Visit: Payer: Self-pay

## 2024-02-14 ENCOUNTER — Emergency Department (HOSPITAL_BASED_OUTPATIENT_CLINIC_OR_DEPARTMENT_OTHER)
Admission: EM | Admit: 2024-02-14 | Discharge: 2024-02-14 | Disposition: A | Discharge status: Home / Self Care | Attending: Emergency Medicine | Admitting: Emergency Medicine

## 2024-02-14 ENCOUNTER — Encounter (HOSPITAL_BASED_OUTPATIENT_CLINIC_OR_DEPARTMENT_OTHER): Payer: Self-pay

## 2024-02-14 DIAGNOSIS — R059 Cough, unspecified: Secondary | ICD-10-CM | POA: Diagnosis present

## 2024-02-14 DIAGNOSIS — J069 Acute upper respiratory infection, unspecified: Secondary | ICD-10-CM | POA: Insufficient documentation

## 2024-02-14 DIAGNOSIS — I10 Essential (primary) hypertension: Secondary | ICD-10-CM | POA: Insufficient documentation

## 2024-02-14 DIAGNOSIS — E119 Type 2 diabetes mellitus without complications: Secondary | ICD-10-CM | POA: Insufficient documentation

## 2024-02-14 DIAGNOSIS — B9789 Other viral agents as the cause of diseases classified elsewhere: Secondary | ICD-10-CM | POA: Diagnosis not present

## 2024-02-14 HISTORY — DX: Type 2 diabetes mellitus without complications: E11.9

## 2024-02-14 LAB — RESP PANEL BY RT-PCR (RSV, FLU A&B, COVID)  RVPGX2
Influenza A by PCR: NEGATIVE
Influenza B by PCR: NEGATIVE
Resp Syncytial Virus by PCR: NEGATIVE
SARS Coronavirus 2 by RT PCR: NEGATIVE

## 2024-02-14 MED ORDER — GUAIFENESIN-DM 100-10 MG/5ML PO SYRP
5.0000 mL | ORAL_SOLUTION | Freq: Once | ORAL | Status: AC
  Administered 2024-02-14: 5 mL via ORAL
  Filled 2024-02-14: qty 5

## 2024-02-14 MED ORDER — FLUTICASONE PROPIONATE 50 MCG/ACT NA SUSP: 1.0000 | Freq: Every day | NASAL | 2 refills | Status: AC

## 2024-02-14 MED ORDER — BENZONATATE 100 MG PO CAPS: 100.0000 mg | ORAL_CAPSULE | Freq: Three times a day (TID) | ORAL | 0 refills | Status: AC

## 2024-02-14 MED ORDER — ACETAMINOPHEN 325 MG PO TABS
650.0000 mg | ORAL_TABLET | Freq: Once | ORAL | Status: AC
  Administered 2024-02-14: 650 mg via ORAL
  Filled 2024-02-14: qty 2

## 2024-02-14 NOTE — ED Provider Notes (Signed)
 " [facility] Provider Note   CSN: [id number] Arrival date & time: 02/14/24  9057     Patient presents with: Generalized Body Aches and Cough   Kelli Carroll is a 49 y.o. female.   Patient with history of diabetes, GERD, hypertension presents today with complaints of cough and congestion. Reports that symptoms began initially on Tuesday and have been persistent since then. States that her fiance is here sick with similar symptoms. States that she has been taking motrin  and OTC cough medications with some improvement. Reports she is unable to breath out of her nose due to congestion. Reports that her cough is dry and nonproductive. Also reports some crusting in her eyes in the morning for which she has been using a warm towel to wipe off. Denies any pain in her eyes or changes in her vision. Her ears are bothering her as well, the left is worse than the right. Denies fevers or chills. No chest pain or shortness of breath, nausea, vomiting, diarrhea, or abdominal pain.   The history is provided by the patient. No language interpreter was used.  Cough      Prior to Admission medications  Medication Sig Start Date End Date Taking? Authorizing Provider  acetaminophen  (TYLENOL ) 500 MG tablet Take 1,000 mg by mouth every 6 (six) hours as needed for moderate pain.    [provider]  amoxicillin -clavulanate (AUGMENTIN ) 875-125 MG tablet Take 1 tablet by mouth every 12 (twelve) hours. Patient not taking: Reported on 01/28/2023 09/19/22   Elnor Savant A, DO  cetirizine (ZYRTEC) 10 MG tablet Take 10 mg by mouth at bedtime.    [provider]  chlorthalidone  (HYGROTON ) 25 MG tablet Take 25 mg by mouth daily. Patient not taking: Reported on 01/28/2023    [provider]  Cholecalciferol (VITAMIN D) 50 MCG (2000 UT) CAPS Take 2,000 Units by mouth daily.    [provider]  dicyclomine (BENTYL) 10 MG capsule Take 10 mg by  mouth 2 (two) times daily. Patient not taking: Reported on 01/28/2023    [provider]  esomeprazole (NEXIUM) 40 MG capsule Take 40 mg by mouth daily. 05/10/18   [provider]  guaiFENesin -codeine  100-10 MG/5ML syrup Take 5 mLs by mouth 3 (three) times daily as needed for cough. Patient not taking: Reported on 01/28/2023 03/14/13   Vicky Charleston, PA-C  ibuprofen  (ADVIL ) 200 MG tablet Take 400 mg by mouth every 6 (six) hours as needed for moderate pain. Patient not taking: Reported on 01/28/2023    [provider]  naproxen  (NAPROSYN ) 500 MG tablet Take 1 tablet (500 mg total) by mouth 2 (two) times daily. Patient not taking: Reported on 01/28/2023 06/13/17   Rendell Lorane HERO, PA-C  olmesartan (BENICAR) 20 MG tablet Take 20 mg by mouth daily. 11/05/22   [provider]  omeprazole (PRILOSEC) 40 MG capsule Take 1 capsule by mouth daily. 09/23/11   [provider]  ondansetron  (ZOFRAN  ODT) 4 MG disintegrating tablet Take 1 tablet (4 mg total) by mouth every 8 (eight) hours as needed for nausea or vomiting. Patient not taking: Reported on 01/28/2023 05/27/20   Joldersma, Logan, PA-C  potassium chloride  (K-DUR) 10 MEQ tablet Take 1 tablet (10 mEq total) by mouth daily. Patient not taking: Reported on 01/28/2023 06/13/17   Rendell Lorane HERO, PA-C  potassium chloride  SA (KLOR-CON  M) 20 MEQ tablet Take 2 tablets (40 mEq total) by mouth 2 (two) times daily. Patient not  taking: Reported on 01/28/2023 07/06/22   Ruthe Cornet, DO  traMADol  (ULTRAM ) 50 MG tablet Take 1-2 tablets (50-100 mg total) by mouth every 6 (six) hours as needed. Patient not taking: Reported on 01/28/2023 09/02/18   Eletha Boas, MD  triamterene-hydrochlorothiazide (DYAZIDE) 37.5-25 MG capsule Take 1 capsule by mouth daily. 01/20/23   [provider]  venlafaxine XR (EFFEXOR-XR) 37.5 MG 24 hr capsule Take 37.5 mg by mouth daily with breakfast. Patient not taking: Reported on 01/28/2023    [provider]    Allergies: Augmentin  [amoxicillin -pot clavulanate] and Keflex  [cephalexin ]    Review of Systems  HENT:  Positive for congestion.   Respiratory:  Positive for cough.   All other systems reviewed and are negative.   Updated Vital Signs BP (!) 154/106 (BP Location: Left Arm)   Pulse 95   Temp 98.8 F (37.1 C) (Oral)   Resp 16   Ht 5' 2 (1.575 m)   Wt 118.8 kg   SpO2 98%   BMI 47.92 kg/m   Physical Exam Vitals and nursing note reviewed.  Constitutional:      General: She is not in acute distress.    Appearance: Normal appearance. She is normal weight. She is not ill-appearing, toxic-appearing or diaphoretic.  HENT:     Head: Normocephalic and atraumatic.     Right Ear: Tympanic membrane, ear canal and external ear normal.     Left Ear: Tympanic membrane, ear canal and external ear normal.     Mouth/Throat:     Pharynx: Uvula midline.     Tonsils: No tonsillar exudate or tonsillar abscesses. 1+ on the right. 1+ on the left.  Eyes:     Extraocular Movements: Extraocular movements intact.     Conjunctiva/sclera: Conjunctivae normal.     Pupils: Pupils are equal, round, and reactive to light.  Cardiovascular:     Rate and Rhythm: Normal rate and regular rhythm.     Heart sounds: Normal heart sounds.  Pulmonary:     Effort: Pulmonary effort is normal. No respiratory distress.     Breath sounds: Normal breath sounds.  Abdominal:     General: Abdomen is flat.     Palpations: Abdomen is soft.     Tenderness: There is no abdominal tenderness.  Musculoskeletal:        General: Normal range of motion.     Cervical back: Normal range of motion and neck supple.  Lymphadenopathy:     Cervical: No cervical adenopathy.  Skin:    General: Skin is warm and dry.  Neurological:     General: No focal deficit present.     Mental Status: She is alert.  Psychiatric:        Mood and Affect: Mood normal.        Behavior: Behavior normal.     (all labs ordered are  listed, but only abnormal results are displayed) Labs Reviewed  RESP PANEL BY RT-PCR (RSV, FLU A&B, COVID)  [id number]    EKG: None  Radiology: No results found.   Procedures   Medications Ordered in the ED  acetaminophen  (TYLENOL ) tablet 650 mg (has no administration in time range)  guaiFENesin -dextromethorphan  (ROBITUSSIN DM) 100-10 MG/5ML syrup 5 mL (has no administration in time range)                                    Medical Decision Making Risk OTC drugs.  Patient presents today with complaints of cough and congestion x 6 days.  They are afebrile, nontoxic-appearing, and in no acute distress with reassuring vital signs.  Physical exam reveals lung sounds clear to auscultation in all fields. No indication for CXR imaging at this time. Patient negative for COVID, flu, and RSV.  However, patient's symptoms likely due to URI, likely viral etiology.  Discussed with patient that there is no indication for antibiotics for viral infections. Will send for tessalon  and flonase  for symptoms and recommend close outpatient follow-up with return precautions. Evaluation and diagnostic testing in the emergency department does not suggest an emergent condition requiring admission or immediate intervention beyond what has been performed at this time.  Plan for discharge with close PCP follow-up.  Patient is understanding and amenable with plan, educated on red flag symptoms that would prompt immediate return.  Patient discharged in stable condition.  Final diagnoses:  Viral URI with cough    ED Discharge Orders          Ordered    benzonatate  (TESSALON ) 100 MG capsule  Every 8 hours        02/14/24 1049    fluticasone  (FLONASE ) 50 MCG/ACT nasal spray  Daily        02/14/24 1049          An After Visit Summary was printed and given to the patient.      Derrich Gaby A, PA-C 02/14/24 1050    Tonia Chew, MD 02/14/24 1457  "

## 2024-02-14 NOTE — ED Notes (Signed)
 ED Provider at bedside.

## 2024-02-14 NOTE — Discharge Instructions (Signed)
 Your work-up in the ER today was reassuring for acute findings. You were swabbed for covid, flu, and RSV which were negative. However, your symptoms are still likely related to an upper respiratory infection. As these are almost always viral in nature, no antibiotics are indicated. I recommend that you get plenty of rest and focus on symptomatic relief which includes Cepacol throat lozenges for sore throat, Mucinex  D (orange box) which you can get from behind the counter at your local pharmacy for congestion, and tylenol /ibuprofen  as needed for fevers and bodyaches. I have also given you a prescription for tessalon  and flonase  which is a cough and congestion medication for you to take as prescribed for management of your symptoms. I also recommend:  Increased fluid intake. Sports drinks offer valuable electrolytes, sugars, and fluids.  Breathing heated mist or steam (vaporizer or shower).  Eating chicken soup or other clear broths, and maintaining good nutrition.   Increasing usage of your inhaler if you have asthma.  Return to work when your temperature has returned to normal.  Gargle warm salt water and spit it out for sore throat. Take benadryl  or Zyrtec to decrease sinus secretions.  Follow Up: Follow up with your primary care doctor in 5-7 days for recheck of ongoing symptoms.  Return to emergency department for emergent changing or worsening of symptoms.

## 2024-02-14 NOTE — ED Triage Notes (Signed)
 Pt reporting being sick since Tuesday. Pt reporting matted eyes upon waking this AM, L ear pain, body aches, throat pain, non-productive cough. Denies fever. Denies N/V/D.
